# Patient Record
Sex: Female | Born: 1990 | Race: White | Hispanic: No | Marital: Married | Smoking: Never smoker
Health system: Southern US, Community
[De-identification: ages and names within clinical notes are randomized; demographics above are authoritative.]

## PROBLEM LIST (undated history)

## (undated) DIAGNOSIS — K449 Diaphragmatic hernia without obstruction or gangrene: Secondary | ICD-10-CM

## (undated) DIAGNOSIS — O24419 Gestational diabetes mellitus in pregnancy, unspecified control: Secondary | ICD-10-CM

## (undated) HISTORY — DX: Gestational diabetes mellitus in pregnancy, unspecified control: O24.419

## (undated) HISTORY — PX: CHOLECYSTECTOMY: SHX55

---

## 2019-06-14 LAB — OB RESULTS CONSOLE RPR: RPR: NONREACTIVE

## 2019-06-14 LAB — OB RESULTS CONSOLE ANTIBODY SCREEN: Antibody Screen: NEGATIVE

## 2019-06-14 LAB — OB RESULTS CONSOLE ABO/RH: RH Type: NEGATIVE

## 2019-06-14 LAB — OB RESULTS CONSOLE GC/CHLAMYDIA
Chlamydia: NEGATIVE
Gonorrhea: NEGATIVE

## 2019-06-14 LAB — OB RESULTS CONSOLE HEPATITIS B SURFACE ANTIGEN: Hepatitis B Surface Ag: NEGATIVE

## 2019-06-14 LAB — OB RESULTS CONSOLE HIV ANTIBODY (ROUTINE TESTING): HIV: NONREACTIVE

## 2019-06-14 LAB — OB RESULTS CONSOLE RUBELLA ANTIBODY, IGM: Rubella: IMMUNE

## 2019-06-25 NOTE — L&D Delivery Note (Signed)
Delivery Note  SVD viable female Apgars 8,9 over 2nd degree ML lac.  Placenta delivered spontaneously intact with 3VC. Repair with 2-0 Chromic with good support and hemostasis noted.  R/V exam confirms.  PH art was sent.   Mother and baby to [facility] and are doing well.  EBL 100cc  Diondra Pines, MD 

## 2019-09-20 ENCOUNTER — Inpatient Hospital Stay (HOSPITAL_BASED_OUTPATIENT_CLINIC_OR_DEPARTMENT_OTHER): Payer: BC Managed Care – PPO

## 2019-09-20 ENCOUNTER — Inpatient Hospital Stay (HOSPITAL_COMMUNITY)
Admission: AD | Admit: 2019-09-20 | Discharge: 2019-09-21 | Disposition: A | Discharge status: Home / Self Care | Payer: BC Managed Care – PPO | Attending: Obstetrics and Gynecology | Admitting: Obstetrics and Gynecology

## 2019-09-20 ENCOUNTER — Other Ambulatory Visit: Payer: Self-pay

## 2019-09-20 ENCOUNTER — Encounter (HOSPITAL_COMMUNITY): Payer: Self-pay | Admitting: Obstetrics and Gynecology

## 2019-09-20 DIAGNOSIS — O4692 Antepartum hemorrhage, unspecified, second trimester: Secondary | ICD-10-CM | POA: Diagnosis present

## 2019-09-20 DIAGNOSIS — Z8616 Personal history of COVID-19: Secondary | ICD-10-CM | POA: Diagnosis not present

## 2019-09-20 DIAGNOSIS — Z881 Allergy status to other antibiotic agents status: Secondary | ICD-10-CM | POA: Insufficient documentation

## 2019-09-20 DIAGNOSIS — Z3A22 22 weeks gestation of pregnancy: Secondary | ICD-10-CM

## 2019-09-20 HISTORY — DX: Diaphragmatic hernia without obstruction or gangrene: K44.9

## 2019-09-20 LAB — URINALYSIS, ROUTINE W REFLEX MICROSCOPIC
Bilirubin Urine: NEGATIVE
Glucose, UA: NEGATIVE mg/dL
Ketones, ur: NEGATIVE mg/dL
Leukocytes,Ua: NEGATIVE
Nitrite: NEGATIVE
Protein, ur: NEGATIVE mg/dL
Specific Gravity, Urine: 1.006 (ref 1.005–1.030)
pH: 7 (ref 5.0–8.0)

## 2019-09-20 LAB — WET PREP, GENITAL
Clue Cells Wet Prep HPF POC: NONE SEEN
Sperm: NONE SEEN
Trich, Wet Prep: NONE SEEN
WBC, Wet Prep HPF POC: NONE SEEN
Yeast Wet Prep HPF POC: NONE SEEN

## 2019-09-20 NOTE — MAU Note (Signed)
Pt states she went back to work on Friday after being out due to Covid. States she started having some brown discharge that looked like snot. States she called MD office and was told to be off feet over the weekend. States she went back to work for half a day today and started to have bright red blood while trying to have a BM. Pt reports some mild sharp pain and lower abdominal cramping. Rates pain 2/10. Denies LOF.

## 2019-09-21 NOTE — MAU Provider Note (Signed)
History     CSN: [id number]  Arrival date and time: 09/20/19 2030   First Provider Initiated Contact with Patient 09/20/19 2234      Chief Complaint  Patient presents with  . Vaginal Bleeding   Ms.  Jamie Wagner is a 29 y.o. year old G1P0 female at [redacted]w[redacted]d weeks gestation who presents to MAU reporting she returned to work from being out with COVID-19 on Friday 09/17/19 and started having brown d/c "like snot." She was told by her OB to "stay off feet over the weekend." She returned to working 1/2 day today and saw BRB after having a BM, mild sharp pain in lower abdominal pain/cramping; rated 2/10. She denies LOF. She reports (+) FM. She receives [facility] with [facility].   OB History    Gravida  1   Para      Term      Preterm      AB      Living        SAB      TAB      Ectopic      Multiple      Live Births              Past Medical History:  Diagnosis Date  . Hiatal hernia     Past Surgical History:  Procedure Laterality Date  . CHOLECYSTECTOMY      History reviewed. No pertinent family history.  Social History   Tobacco Use  . Smoking status: Never Smoker  . Smokeless tobacco: Never Used  Substance Use Topics  . Alcohol use: Never  . Drug use: Never    Allergies:  Allergies  Allergen Reactions  . Azithromycin Nausea And Vomiting    No medications prior to admission.    Review of Systems  Constitutional: Negative.   HENT: Negative.   Eyes: Negative.   Respiratory: Negative.   Cardiovascular: Negative.   Gastrointestinal: Negative.   Endocrine: Negative.   Genitourinary: Positive for pelvic pain (lower abdominal cramping) and vaginal bleeding (BRB after BM @ 1700 today, none since arrival to MAU).  Musculoskeletal: Negative.   Skin: Negative.   Allergic/Immunologic: Negative.   Neurological: Negative.   Hematological: Negative.   Psychiatric/Behavioral: Negative.    Physical Exam   Blood pressure 121/78, pulse 88,  temperature 97.9 F (36.6 C), temperature source Oral, resp. rate 18, height 5\' 5"  (1.651 m), weight 71.6 kg, SpO2 100 %.  Physical Exam  Nursing note and vitals reviewed. Constitutional: She is oriented to person, place, and time. She appears well-developed and well-nourished.  HENT:  Head: Normocephalic and atraumatic.  Eyes: Pupils are equal, round, and reactive to light.  Cardiovascular: Normal rate.  Respiratory: Effort normal.  GI: Soft.  Genitourinary:    Genitourinary Comments: Uterus: gravid, S=D, SE: cervix is smooth, pink, no lesions, small amt of thick mucoid discharge with brown streaks throughout coming from cervical os; removed without difficulty with large cotton swab -- WP, GC/CT done, closed/long/firm, no CMT or friability, no adnexal tenderness    Musculoskeletal:        General: Normal range of motion.     Cervical back: Normal range of motion.  Neurological: She is alert and oriented to person, place, and time.  Skin: Skin is warm and dry.  Psychiatric: She has a normal mood and affect. Her behavior is normal. Judgment and thought content normal.    MAU Course  Procedures  MDM CCUA Wet Prep GC/CT -- Results pending  OB MFM Limited U/S -- as seen on preliminary results below  Results for orders placed or performed during the hospital encounter of 09/20/19 (from the past 24 hour(s))  Urinalysis, Routine w reflex microscopic     Status: Abnormal   Collection Time: 09/20/19 10:16 PM  Result Value Ref Range   Color, Urine STRAW (A) YELLOW   APPearance CLEAR CLEAR   Specific Gravity, Urine 1.006 1.005 - 1.030   pH 7.0 5.0 - 8.0   Glucose, UA NEGATIVE NEGATIVE mg/dL   Hgb urine dipstick SMALL (A) NEGATIVE   Bilirubin Urine NEGATIVE NEGATIVE   Ketones, ur NEGATIVE NEGATIVE mg/dL   Protein, ur NEGATIVE NEGATIVE mg/dL   Nitrite NEGATIVE NEGATIVE   Leukocytes,Ua NEGATIVE NEGATIVE   RBC / HPF 0-5 0 - 5 RBC/hpf   WBC, UA 0-5 0 - 5 WBC/hpf   Bacteria, UA RARE  (A) NONE SEEN   Squamous Epithelial / LPF 0-5 0 - 5   Mucus PRESENT   Wet prep, genital     Status: None   Collection Time: 09/20/19 10:48 PM  Result Value Ref Range   Yeast Wet Prep HPF POC NONE SEEN NONE SEEN   Trich, Wet Prep NONE SEEN NONE SEEN   Clue Cells Wet Prep HPF POC NONE SEEN NONE SEEN   WBC, Wet Prep HPF POC NONE SEEN NONE SEEN   Sperm NONE SEEN     Media Information        Assessment and Plan  Vaginal bleeding in pregnancy, second trimester  - Information provided on vaginal bleeding in 2nd trimester pregnancy - Return to MAU:  If you have heavier bleeding that soaks through more that 2 pads per hour for an hour or more  If you bleed so much that you feel like you might pass out or you do pass out  If you have significant abdominal pain that is not improved with Tylenol   If you develop a fever > 100.5   - Discharge patient - Keep scheduled appt with [facility] - Patient verbalized an understanding of the plan of care and agrees.   *Patient and spouse requested information on COVID-19 Vaccine and pregnancy -- ACOG information was emailed to her email address on-file after discharge home.   Laury Deep, CNM 09/21/2019, 10:34 PM

## 2019-09-22 LAB — GC/CHLAMYDIA PROBE AMP (~~LOC~~) NOT AT ARMC
Chlamydia: NEGATIVE
Comment: NEGATIVE
Comment: NORMAL
Neisseria Gonorrhea: NEGATIVE

## 2019-12-23 LAB — OB RESULTS CONSOLE GBS: GBS: NEGATIVE

## 2020-01-05 ENCOUNTER — Telehealth (HOSPITAL_COMMUNITY): Payer: Self-pay | Admitting: *Deleted

## 2020-01-05 NOTE — Telephone Encounter (Signed)
06/25/2018

## 2020-01-07 ENCOUNTER — Inpatient Hospital Stay (HOSPITAL_COMMUNITY): Payer: BC Managed Care – PPO | Admitting: Anesthesiology

## 2020-01-07 ENCOUNTER — Inpatient Hospital Stay (HOSPITAL_COMMUNITY)
Admission: AD | Admit: 2020-01-07 | Discharge: 2020-01-10 | DRG: 807 | Disposition: A | Discharge status: Home / Self Care | Payer: BC Managed Care – PPO | Attending: Obstetrics and Gynecology | Admitting: Obstetrics and Gynecology

## 2020-01-07 ENCOUNTER — Other Ambulatory Visit: Payer: Self-pay

## 2020-01-07 ENCOUNTER — Encounter (HOSPITAL_COMMUNITY): Payer: Self-pay | Admitting: Obstetrics and Gynecology

## 2020-01-07 DIAGNOSIS — Z20822 Contact with and (suspected) exposure to covid-19: Secondary | ICD-10-CM | POA: Diagnosis present

## 2020-01-07 DIAGNOSIS — Z3A38 38 weeks gestation of pregnancy: Secondary | ICD-10-CM | POA: Diagnosis not present

## 2020-01-07 DIAGNOSIS — O134 Gestational [pregnancy-induced] hypertension without significant proteinuria, complicating childbirth: Principal | ICD-10-CM | POA: Diagnosis present

## 2020-01-07 LAB — TYPE AND SCREEN
ABO/RH(D): O NEG
Antibody Screen: POSITIVE

## 2020-01-07 LAB — COMPREHENSIVE METABOLIC PANEL
ALT: 19 U/L (ref 0–44)
AST: 27 U/L (ref 15–41)
Albumin: 2.8 g/dL — ABNORMAL LOW (ref 3.5–5.0)
Alkaline Phosphatase: 122 U/L (ref 38–126)
Anion gap: 9 (ref 5–15)
BUN: 6 mg/dL (ref 6–20)
CO2: 19 mmol/L — ABNORMAL LOW (ref 22–32)
Calcium: 8.7 mg/dL — ABNORMAL LOW (ref 8.9–10.3)
Chloride: 109 mmol/L (ref 98–111)
Creatinine, Ser: 0.68 mg/dL (ref 0.44–1.00)
GFR calc Af Amer: >60 mL/min (ref >60)
GFR calc non Af Amer: >60 mL/min (ref >60)
Glucose, Bld: 84 mg/dL (ref 70–99)
Potassium: 4.3 mmol/L (ref 3.5–5.1)
Sodium: 137 mmol/L (ref 135–145)
Total Bilirubin: 0.7 mg/dL (ref 0.3–1.2)
Total Protein: 6.1 g/dL — ABNORMAL LOW (ref 6.5–8.1)

## 2020-01-07 LAB — ABO/RH: ABO/RH(D): O NEG

## 2020-01-07 LAB — CBC
HCT: 39.8 % (ref 36.0–46.0)
Hemoglobin: 12.5 g/dL (ref 12.0–15.0)
MCH: 27.7 pg (ref 26.0–34.0)
MCHC: 31.4 g/dL (ref 30.0–36.0)
MCV: 88.2 fL (ref 80.0–100.0)
Platelets: 249 10*3/uL (ref 150–400)
RBC: 4.51 MIL/uL (ref 3.87–5.11)
RDW: 14.2 % (ref 11.5–15.5)
WBC: 10.3 10*3/uL (ref 4.0–10.5)
nRBC: 0 % (ref 0.0–0.2)

## 2020-01-07 LAB — SARS CORONAVIRUS 2 BY RT PCR (HOSPITAL ORDER, PERFORMED IN ~~LOC~~ HOSPITAL LAB): SARS Coronavirus 2: NEGATIVE

## 2020-01-07 MED ORDER — TERBUTALINE SULFATE 1 MG/ML IJ SOLN: 0.2500 mg | Freq: Once | INTRAMUSCULAR | Status: DC | PRN

## 2020-01-07 MED ORDER — PHENYLEPHRINE 40 MCG/ML (10ML) SYRINGE FOR IV PUSH (FOR BLOOD PRESSURE SUPPORT): 80.0000 ug | PREFILLED_SYRINGE | INTRAVENOUS | Status: DC | PRN

## 2020-01-07 MED ORDER — OXYCODONE-ACETAMINOPHEN 5-325 MG PO TABS: 2.0000 | ORAL_TABLET | ORAL | Status: DC | PRN

## 2020-01-07 MED ORDER — EPHEDRINE 5 MG/ML INJ: 10.0000 mg | INTRAVENOUS | Status: DC | PRN

## 2020-01-07 MED ORDER — LIDOCAINE HCL (PF) 1 % IJ SOLN
INTRAMUSCULAR | Status: DC | PRN
  Administered 2020-01-07: 10 mL via EPIDURAL

## 2020-01-07 MED ORDER — OXYTOCIN BOLUS FROM INFUSION
333.0000 mL | Freq: Once | INTRAVENOUS | Status: AC
  Administered 2020-01-08: 333 mL via INTRAVENOUS

## 2020-01-07 MED ORDER — DIPHENHYDRAMINE HCL 50 MG/ML IJ SOLN: 12.5000 mg | INTRAMUSCULAR | Status: DC | PRN

## 2020-01-07 MED ORDER — LIDOCAINE HCL (PF) 1 % IJ SOLN: 30.0000 mL | INTRAMUSCULAR | Status: DC | PRN

## 2020-01-07 MED ORDER — SODIUM CHLORIDE (PF) 0.9 % IJ SOLN
INTRAMUSCULAR | Status: DC | PRN
  Administered 2020-01-07: 12 mL/h via EPIDURAL

## 2020-01-07 MED ORDER — OXYTOCIN-SODIUM CHLORIDE 30-0.9 UT/500ML-% IV SOLN
1.0000 m[IU]/min | INTRAVENOUS | Status: DC
  Administered 2020-01-07: 2 m[IU]/min via INTRAVENOUS
  Filled 2020-01-07: qty 500

## 2020-01-07 MED ORDER — LACTATED RINGERS IV SOLN: INTRAVENOUS | Status: DC

## 2020-01-07 MED ORDER — LACTATED RINGERS IV SOLN: 500.0000 mL | INTRAVENOUS | Status: DC | PRN

## 2020-01-07 MED ORDER — FENTANYL CITRATE (PF) 100 MCG/2ML IJ SOLN
INTRAMUSCULAR | Status: AC
  Administered 2020-01-07: 100 ug
  Filled 2020-01-07: qty 2

## 2020-01-07 MED ORDER — ONDANSETRON HCL 4 MG/2ML IJ SOLN
4.0000 mg | Freq: Four times a day (QID) | INTRAMUSCULAR | Status: DC | PRN
  Administered 2020-01-08: 4 mg via INTRAVENOUS
  Filled 2020-01-07: qty 2

## 2020-01-07 MED ORDER — SOD CITRATE-CITRIC ACID 500-334 MG/5ML PO SOLN: 30.0000 mL | ORAL | Status: DC | PRN

## 2020-01-07 MED ORDER — FENTANYL-BUPIVACAINE-NACL 0.5-0.125-0.9 MG/250ML-% EP SOLN
12.0000 mL/h | EPIDURAL | Status: DC | PRN
  Filled 2020-01-07: qty 250

## 2020-01-07 MED ORDER — OXYTOCIN-SODIUM CHLORIDE 30-0.9 UT/500ML-% IV SOLN
2.5000 [IU]/h | INTRAVENOUS | Status: DC
  Filled 2020-01-07: qty 500

## 2020-01-07 MED ORDER — ACETAMINOPHEN 325 MG PO TABS: 650.0000 mg | ORAL_TABLET | ORAL | Status: DC | PRN

## 2020-01-07 MED ORDER — OXYCODONE-ACETAMINOPHEN 5-325 MG PO TABS: 1.0000 | ORAL_TABLET | ORAL | Status: DC | PRN

## 2020-01-07 MED ORDER — LACTATED RINGERS IV SOLN
500.0000 mL | Freq: Once | INTRAVENOUS | Status: AC
  Administered 2020-01-07: 500 mL via INTRAVENOUS

## 2020-01-07 NOTE — Anesthesia Procedure Notes (Addendum)
Epidural Patient location during procedure: OB  Staffing Anesthesiologist: Elmer Picker, MD Performed: anesthesiologist   Preanesthetic Checklist Completed: patient identified, IV checked, risks and benefits discussed, monitors and equipment checked, pre-op evaluation and timeout performed  Epidural Patient position: sitting Prep: DuraPrep and site prepped and draped Patient monitoring: continuous pulse ox, blood pressure, heart rate and cardiac monitor Approach: midline Location: L3-L4 Injection technique: LOR air  Needle:  Needle type: Tuohy  Needle gauge: 17 G Needle length: 9 cm Needle insertion depth: 5 cm Catheter type: closed end flexible Catheter size: 19 Gauge Catheter at skin depth: 11 cm Test dose: negative  Assessment Sensory level: T8 Events: blood not aspirated, injection not painful, no injection resistance, no paresthesia and negative IV test  Additional Notes Patient identified. Risks/Benefits/Options discussed with patient including but not limited to bleeding, infection, nerve damage, paralysis, failed block, incomplete pain control, headache, blood pressure changes, nausea, vomiting, reactions to medication both or allergic, itching and postpartum back pain. Confirmed with bedside nurse the patient's most recent platelet count. Confirmed with patient that they are not currently taking any anticoagulation, have any bleeding history or any family history of bleeding disorders. Patient expressed understanding and wished to proceed. All questions were answered. Sterile technique was used throughout the entire procedure. Please see nursing notes for vital signs. Test dose was given through epidural catheter and negative prior to continuing to dose epidural or start infusion. Warning signs of high block given to the patient including shortness of breath, tingling/numbness in hands, complete motor block, or any concerning symptoms with instructions to call for  help. Patient was given instructions on fall risk and not to get out of bed. All questions and concerns addressed with instructions to call with any issues or inadequate analgesia.  Reason for block:procedure for pain

## 2020-01-07 NOTE — H&P (Signed)
Jamie Wagner is a 29 y.o. female presenting for IOL at term due to [facility].  Seen in the office today by Dr Henderson Cloud with elevated BPs (not severe) and with favorable cx per Dr Henderson Cloud and sent for IOL.  Denies [place], RUQ pain, HA GBS neg . OB History    Gravida  1   Para  0   Term  0   Preterm  0   AB  0   Living  0     SAB  0   TAB  0   Ectopic  0   Multiple  0   Live Births  0          Past Medical History:  Diagnosis Date  . Hiatal hernia    Past Surgical History:  Procedure Laterality Date  . CHOLECYSTECTOMY     Family History: family history is not on file. Social History:  reports that she has never smoked. She has never used smokeless tobacco. She reports that she does not drink alcohol and does not use drugs.     Maternal Diabetes: No Genetic Screening: Normal Maternal Ultrasounds/Referrals: Normal Fetal Ultrasounds or other Referrals:  None Maternal Substance Abuse:  No Significant Maternal Medications:  None Significant Maternal Lab Results:  Group B Strep negative Other Comments:  None  Review of Systems History Dilation: 3 Effacement (%): 50 Station: Ballotable Exam by:: Montez Morita, RNC Resp. rate 18, height 5\' 5"  (1.651 m), weight 89.7 kg. Exam Physical Exam  Prenatal labs: ABO, Rh: --/--/O NEG Performed at [facility], [address] [place], [place]  [phone]) Antibody: POS (07/16 1310) Rubella: Immune (12/21 0000) RPR: Nonreactive (12/21 0000)  HBsAg: Negative (12/21 0000)  HIV: Non-reactive (12/21 0000)  GBS: Negative/-- (07/01 0000)   Assessment/Plan: Gestational HTN at term Normal labs and no severe features Was admitted and started on pitocin, but not having strong ctxs despite increases in pit.  Cx exam per RN is ballotable so unable to AROM.  Will cont with Pit for now, but consider change to cytotec overnight.  Clinically stable.   08-29-2005 01/07/2020, 4:44 PM

## 2020-01-07 NOTE — Anesthesia Preprocedure Evaluation (Signed)
Anesthesia Evaluation  Patient identified by MRN, date of birth, ID band Patient awake    Reviewed: Allergy & Precautions, NPO status , Patient's Chart, lab work & pertinent test results  Airway Mallampati: II  TM Distance: >3 FB Neck ROM: Full    Dental no notable dental hx.    Pulmonary neg pulmonary ROS,    Pulmonary exam normal breath sounds clear to auscultation       Cardiovascular negative cardio ROS Normal cardiovascular exam Rhythm:Regular Rate:Normal     Neuro/Psych negative neurological ROS  negative psych ROS   GI/Hepatic Neg liver ROS, hiatal hernia,   Endo/Other  negative endocrine ROS  Renal/GU negative Renal ROS  negative genitourinary   Musculoskeletal negative musculoskeletal ROS (+)   Abdominal   Peds  Hematology negative hematology ROS (+)   Anesthesia Other Findings IOL for gHTN  Reproductive/Obstetrics (+) Pregnancy                             Anesthesia Physical Anesthesia Plan  ASA: II  Anesthesia Plan: Epidural   Post-op Pain Management:    Induction:   PONV Risk Score and Plan: Treatment may vary due to age or medical condition  Airway Management Planned: Natural Airway  Additional Equipment:   Intra-op Plan:   Post-operative Plan:   Informed Consent: I have reviewed the patients History and Physical, chart, labs and discussed the procedure including the risks, benefits and alternatives for the proposed anesthesia with the patient or authorized representative who has indicated his/her understanding and acceptance.       Plan Discussed with: Anesthesiologist  Anesthesia Plan Comments: (Patient identified. Risks, benefits, options discussed with patient including but not limited to bleeding, infection, nerve damage, paralysis, failed block, incomplete pain control, headache, blood pressure changes, nausea, vomiting, reactions to medication,  itching, and post partum back pain. Confirmed with bedside nurse the patient's most recent platelet count. Confirmed with the patient that they are not taking any anticoagulation, have any bleeding history or any family history of bleeding disorders. Patient expressed understanding and wishes to proceed. All questions were answered. )        Anesthesia Quick Evaluation

## 2020-01-08 ENCOUNTER — Encounter (HOSPITAL_COMMUNITY): Payer: Self-pay | Admitting: Obstetrics and Gynecology

## 2020-01-08 LAB — CBC
HCT: 35.1 % — ABNORMAL LOW (ref 36.0–46.0)
Hemoglobin: 11.3 g/dL — ABNORMAL LOW (ref 12.0–15.0)
MCH: 28.3 pg (ref 26.0–34.0)
MCHC: 32.2 g/dL (ref 30.0–36.0)
MCV: 87.8 fL (ref 80.0–100.0)
Platelets: 222 10*3/uL (ref 150–400)
RBC: 4 MIL/uL (ref 3.87–5.11)
RDW: 13.9 % (ref 11.5–15.5)
WBC: 19.3 10*3/uL — ABNORMAL HIGH (ref 4.0–10.5)
nRBC: 0 % (ref 0.0–0.2)

## 2020-01-08 LAB — RPR: RPR Ser Ql: NONREACTIVE

## 2020-01-08 MED ORDER — ACETAMINOPHEN 325 MG PO TABS
650.0000 mg | ORAL_TABLET | ORAL | Status: DC | PRN
  Administered 2020-01-08 – 2020-01-09 (×4): 650 mg via ORAL
  Filled 2020-01-08 (×4): qty 2

## 2020-01-08 MED ORDER — PRENATAL MULTIVITAMIN CH
1.0000 | ORAL_TABLET | Freq: Every day | ORAL | Status: DC
  Administered 2020-01-08 – 2020-01-10 (×3): 1 via ORAL
  Filled 2020-01-08 (×3): qty 1

## 2020-01-08 MED ORDER — BENZOCAINE-MENTHOL 20-0.5 % EX AERO
1.0000 "application " | INHALATION_SPRAY | CUTANEOUS | Status: DC | PRN
  Administered 2020-01-10: 1 via TOPICAL
  Filled 2020-01-08 (×2): qty 56

## 2020-01-08 MED ORDER — DIBUCAINE (PERIANAL) 1 % EX OINT: 1.0000 "application " | TOPICAL_OINTMENT | CUTANEOUS | Status: DC | PRN

## 2020-01-08 MED ORDER — ONDANSETRON HCL 4 MG PO TABS: 4.0000 mg | ORAL_TABLET | ORAL | Status: DC | PRN

## 2020-01-08 MED ORDER — MEASLES, MUMPS & RUBELLA VAC IJ SOLR: 0.5000 mL | Freq: Once | INTRAMUSCULAR | Status: DC

## 2020-01-08 MED ORDER — IBUPROFEN 600 MG PO TABS
600.0000 mg | ORAL_TABLET | Freq: Four times a day (QID) | ORAL | Status: DC
  Administered 2020-01-08 – 2020-01-10 (×9): 600 mg via ORAL
  Filled 2020-01-08 (×9): qty 1

## 2020-01-08 MED ORDER — PANTOPRAZOLE SODIUM 40 MG PO TBEC
40.0000 mg | DELAYED_RELEASE_TABLET | Freq: Every day | ORAL | Status: DC
  Administered 2020-01-08 – 2020-01-10 (×3): 40 mg via ORAL
  Filled 2020-01-08 (×3): qty 1

## 2020-01-08 MED ORDER — OXYCODONE-ACETAMINOPHEN 5-325 MG PO TABS: 1.0000 | ORAL_TABLET | ORAL | Status: DC | PRN

## 2020-01-08 MED ORDER — SIMETHICONE 80 MG PO CHEW: 80.0000 mg | CHEWABLE_TABLET | ORAL | Status: DC | PRN

## 2020-01-08 MED ORDER — SENNOSIDES-DOCUSATE SODIUM 8.6-50 MG PO TABS
2.0000 | ORAL_TABLET | ORAL | Status: DC
  Administered 2020-01-08 – 2020-01-09 (×2): 2 via ORAL
  Filled 2020-01-08 (×2): qty 2

## 2020-01-08 MED ORDER — ZOLPIDEM TARTRATE 5 MG PO TABS: 5.0000 mg | ORAL_TABLET | Freq: Every evening | ORAL | Status: DC | PRN

## 2020-01-08 MED ORDER — WITCH HAZEL-GLYCERIN EX PADS: 1.0000 "application " | MEDICATED_PAD | CUTANEOUS | Status: DC | PRN

## 2020-01-08 MED ORDER — MEDROXYPROGESTERONE ACETATE 150 MG/ML IM SUSP: 150.0000 mg | INTRAMUSCULAR | Status: DC | PRN

## 2020-01-08 MED ORDER — OXYCODONE-ACETAMINOPHEN 5-325 MG PO TABS: 2.0000 | ORAL_TABLET | ORAL | Status: DC | PRN

## 2020-01-08 MED ORDER — ONDANSETRON HCL 4 MG/2ML IJ SOLN: 4.0000 mg | INTRAMUSCULAR | Status: DC | PRN

## 2020-01-08 MED ORDER — DIPHENHYDRAMINE HCL 25 MG PO CAPS: 25.0000 mg | ORAL_CAPSULE | Freq: Four times a day (QID) | ORAL | Status: DC | PRN

## 2020-01-08 MED ORDER — TETANUS-DIPHTH-ACELL PERTUSSIS 5-2.5-18.5 LF-MCG/0.5 IM SUSP: 0.5000 mL | Freq: Once | INTRAMUSCULAR | Status: DC

## 2020-01-08 MED ORDER — COCONUT OIL OIL: 1.0000 "application " | TOPICAL_OIL | Status: DC | PRN

## 2020-01-08 NOTE — Anesthesia Postprocedure Evaluation (Signed)
Anesthesia Post Note  Patient: Licensed conveyancer  Procedure(s) Performed: AN AD HOC LABOR EPIDURAL     Patient location during evaluation: Mother Baby Anesthesia Type: Epidural Level of consciousness: awake and alert Pain management: pain level controlled Vital Signs Assessment: post-procedure vital signs reviewed and stable Respiratory status: spontaneous breathing, nonlabored ventilation and respiratory function stable Cardiovascular status: stable Postop Assessment: no headache, no backache and epidural receding Anesthetic complications: no Comments: Per telephone conversation   No complications documented.  Last Vitals:  Vitals:   01/08/20 1511 01/08/20 1836  BP: 134/86 132/85  Pulse: 94 64  Resp: 16 16  Temp: 36.7 C 37 C  SpO2: 100% 100%    Last Pain:  Vitals:   01/08/20 1836  TempSrc: Oral  PainSc: 2    Pain Goal:                Epidural/Spinal Function Cutaneous sensation: Normal sensation (01/08/20 1836), Patient able to flex knees: Yes (01/08/20 1836), Patient able to lift hips off bed: Yes (01/08/20 1836), Back pain beyond tenderness at insertion site: No (01/08/20 1836), Progressively worsening motor and/or sensory loss: No (01/08/20 1836), Bowel and/or bladder incontinence post epidural: No (01/08/20 1836)  Emmaline Kluver N

## 2020-01-08 NOTE — Progress Notes (Signed)
MOB was referred for history of anxiety. °* Referral screened out by Clinical Social Worker because none of the following criteria appear to apply: °~ History of anxiety/depression during this pregnancy, or of post-partum depression following prior delivery. °~ Diagnosis of anxiety and/or depression within last 3 years °OR °* MOB's symptoms currently being treated with medication and/or therapy. °Please contact the Clinical Social Worker if needs arise, by MOB request, or if MOB scores greater than 9/yes to question 10 on Edinburgh Postpartum Depression Screen. ° °Donna Silverman D. Rosea Dory, MSW, LCSW °Clinical Social Worker °[phone] °

## 2020-01-09 LAB — CBC
HCT: 33.4 % — ABNORMAL LOW (ref 36.0–46.0)
Hemoglobin: 10.3 g/dL — ABNORMAL LOW (ref 12.0–15.0)
MCH: 27.3 pg (ref 26.0–34.0)
MCHC: 30.8 g/dL (ref 30.0–36.0)
MCV: 88.6 fL (ref 80.0–100.0)
Platelets: 189 10*3/uL (ref 150–400)
RBC: 3.77 MIL/uL — ABNORMAL LOW (ref 3.87–5.11)
RDW: 14.1 % (ref 11.5–15.5)
WBC: 11.6 10*3/uL — ABNORMAL HIGH (ref 4.0–10.5)
nRBC: 0 % (ref 0.0–0.2)

## 2020-01-09 MED ORDER — IBUPROFEN 600 MG PO TABS: 600.0000 mg | ORAL_TABLET | Freq: Four times a day (QID) | ORAL | 0 refills | Status: DC

## 2020-01-09 MED ORDER — RHO D IMMUNE GLOBULIN 1500 UNIT/2ML IJ SOSY
300.0000 ug | PREFILLED_SYRINGE | Freq: Once | INTRAMUSCULAR | Status: AC
  Administered 2020-01-09: 300 ug via INTRAVENOUS
  Filled 2020-01-09: qty 2

## 2020-01-09 NOTE — Discharge Summary (Signed)
Postpartum Discharge Summary       Patient Name: Jamie Wagner DOB: August 02, 1990 MRN: [id number]  Date of admission: 01/07/2020 Delivery date:01/08/2020  Delivering provider: Louretta Shorten  Date of discharge: 01/09/2020  Admitting diagnosis: Normal labor and delivery [O80] Intrauterine pregnancy: [redacted]w[redacted]d    Secondary diagnosis:  Active Problems:   Normal labor and delivery  Additional problems: gestational HTN    Discharge diagnosis: Term Pregnancy Delivered                                              Post partum procedures:  Augmentation: AROM and Pitocin Complications: None  Hospital course: Induction of Labor With Vaginal Delivery   29y.o. yo G1P1001 at 366w4das admitted to the hospital 01/07/2020 for induction of labor.  Indication for induction: Gestational hypertension.  Patient had an uncomplicated labor course as follows: Membrane Rupture Time/Date: 6:19 PM ,01/07/2020   Delivery Method:Vaginal, Spontaneous  Episiotomy: None  Lacerations:  2nd degree  Details of delivery can be found in separate delivery note.  Patient had a routine postpartum course. Patient is discharged home 01/09/20.  Newborn Data: Birth date:01/08/2020  Birth time:7:43 AM  Gender:Female  Living status:Living  Apgars:8 ,9  Weight:2980 g   Magnesium Sulfate received: No BMZ received: No Rhophylac:N/A MMR:No T-DaP:Given prenatally Flu: N/A Transfusion:No  Physical exam  Vitals:   01/08/20 1511 01/08/20 1836 01/08/20 2245 01/09/20 0529  BP: 134/86 132/85 129/86 117/72  Pulse: 94 64 79 68  Resp: 16 16 17 16   Temp: 98 F (36.7 C) 98.6 F (37 C) 98 F (36.7 C) 97.9 F (36.6 C)  TempSrc: Oral Oral Oral Oral  SpO2: 100% 100% 99% 99%  Weight:      Height:       General: alert, cooperative and no distress Lochia: appropriate Uterine Fundus: firm Incision: Healing well with no significant drainage DVT Evaluation: No evidence of DVT seen on physical exam. Labs: Lab Results   Component Value Date   WBC 11.6 (H) 01/09/2020   HGB 10.3 (L) 01/09/2020   HCT 33.4 (L) 01/09/2020   MCV 88.6 01/09/2020   PLT 189 01/09/2020   CMP Latest Ref Rng & Units 01/07/2020  Glucose 70 - 99 mg/dL 84  BUN 6 - 20 mg/dL 6  Creatinine 0.44 - 1.00 mg/dL 0.68  Sodium 135 - 145 mmol/L 137  Potassium 3.5 - 5.1 mmol/L 4.3  Chloride 98 - 111 mmol/L 109  CO2 22 - 32 mmol/L 19(L)  Calcium 8.9 - 10.3 mg/dL 8.7(L)  Total Protein 6.5 - 8.1 g/dL 6.1(L)  Total Bilirubin 0.3 - 1.2 mg/dL 0.7  Alkaline Phos 38 - 126 U/L 122  AST 15 - 41 U/L 27  ALT 0 - 44 U/L 19   Edinburgh Score: Edinburgh Postnatal Depression Scale Screening Tool 01/08/2020  I have been able to laugh and see the funny side of things. 0  I have looked forward with enjoyment to things. 0  I have blamed myself unnecessarily when things went wrong. 0  I have been anxious or worried for no good reason. 1  I have felt scared or panicky for no good reason. 0  Things have been getting on top of me. 0  I have been so unhappy that I have had difficulty sleeping. 0  I have felt sad or miserable. 0  I have  been so unhappy that I have been crying. 0  The thought of harming myself has occurred to me. 0  Edinburgh Postnatal Depression Scale Total 1      After visit meds:  Allergies as of 01/09/2020      Reactions   Azithromycin Nausea And Vomiting   Cefdinir    Other reaction(s): GI intolerance   Iodinated Diagnostic Agents Nausea Only   Pseudoephedrine Anxiety      Medication List    TAKE these medications   acetaminophen 325 MG tablet Commonly known as: TYLENOL Take 325 mg by mouth every 6 (six) hours as needed for mild pain or headache.   Bonjesta 20-20 MG Tbcr Generic drug: Doxylamine-Pyridoxine ER Take 1 tablet by mouth at bedtime.   ibuprofen 600 MG tablet Commonly known as: ADVIL Take 1 tablet (600 mg total) by mouth every 6 (six) hours.   omeprazole 20 MG capsule Commonly known as: PRILOSEC Take 20 mg  by mouth daily.   prenatal multivitamin Tabs tablet Take 1 tablet by mouth daily at 12 noon.   PROBIOTIC-10 PO Take 1 capsule by mouth daily.   simethicone 80 MG chewable tablet Commonly known as: MYLICON Chew 80 mg by mouth every 6 (six) hours as needed for flatulence.        Discharge home in stable condition Infant Feeding: Bottle Infant Disposition:home with mother Discharge instruction: per After Visit Summary and Postpartum booklet. Activity: Advance as tolerated. Pelvic rest for 6 weeks.  Diet: routine diet Anticipated Birth Control: Unsure Postpartum Appointment:6 weeks Additional Postpartum F/U:   Future Appointments: Future Appointments  Date Time Provider [place]  01/12/2020  9:15 AM [facility]-SCREENING [facility] None   Follow up Visit:      01/09/2020 Luz Lex, MD

## 2020-01-10 LAB — RH IG WORKUP (INCLUDES ABO/RH)
ABO/RH(D): O NEG
Fetal Screen: NEGATIVE
Gestational Age(Wks): 38.4
Unit division: 0

## 2020-01-10 NOTE — Discharge Summary (Signed)
Postpartum Discharge Summary  Date of Service updated January 10, 2020     Patient Name: Jamie Wagner DOB: 1991/05/20 MRN: [id number]  Date of admission: 01/07/2020 Delivery date:01/08/2020  Delivering provider: Louretta Shorten  Date of discharge: 01/10/2020  Admitting diagnosis: Normal labor and delivery [O80] Intrauterine pregnancy: 105w4d    Secondary diagnosis:  Active Problems:   Normal labor and delivery  Additional problems: none    Discharge diagnosis: Term Pregnancy Delivered                                              Post partum procedures:none Augmentation: AROM and Pitocin Complications: None  Hospital course: Induction of Labor With Vaginal Delivery   29y.o. yo G1P1001 at 35w4das admitted to the hospital 01/07/2020 for induction of labor.  Indication for induction: Gestational hypertension.  Patient had an uncomplicated labor course as follows: Membrane Rupture Time/Date: 6:19 PM ,01/07/2020   Delivery Method:Vaginal, Spontaneous  Episiotomy: None  Lacerations:  2nd degree  Details of delivery can be found in separate delivery note.  Patient had a routine postpartum course. Patient is discharged home 01/10/20.  Newborn Data: Birth date:01/08/2020  Birth time:7:43 AM  Gender:Female  Living status:Living  Apgars:8 ,9  Weight:2980 g   Magnesium Sulfate received: No BMZ received: No Rhophylac:No MMR:No T-DaP:Given prenatally Flu: Yes Transfusion:No  Physical exam  Vitals:   01/08/20 2245 01/09/20 0529 01/09/20 1334 01/09/20 2118  BP: 129/86 117/72 134/89 130/72  Pulse: 79 68 72 75  Resp: 17 16 17 15   Temp: 98 F (36.7 C) 97.9 F (36.6 C) 98.1 F (36.7 C) 98.1 F (36.7 C)  TempSrc: Oral Oral Oral Oral  SpO2: 99% 99% 99% 99%  Weight:      Height:       General: alert, cooperative and no distress Lochia: appropriate Uterine Fundus: firm Incision: Healing well with no significant drainage DVT Evaluation: No evidence of DVT seen on physical  exam. Labs: Lab Results  Component Value Date   WBC 11.6 (H) 01/09/2020   HGB 10.3 (L) 01/09/2020   HCT 33.4 (L) 01/09/2020   MCV 88.6 01/09/2020   PLT 189 01/09/2020   CMP Latest Ref Rng & Units 01/07/2020  Glucose 70 - 99 mg/dL 84  BUN 6 - 20 mg/dL 6  Creatinine 0.44 - 1.00 mg/dL 0.68  Sodium 135 - 145 mmol/L 137  Potassium 3.5 - 5.1 mmol/L 4.3  Chloride 98 - 111 mmol/L 109  CO2 22 - 32 mmol/L 19(L)  Calcium 8.9 - 10.3 mg/dL 8.7(L)  Total Protein 6.5 - 8.1 g/dL 6.1(L)  Total Bilirubin 0.3 - 1.2 mg/dL 0.7  Alkaline Phos 38 - 126 U/L 122  AST 15 - 41 U/L 27  ALT 0 - 44 U/L 19   Edinburgh Score: Edinburgh Postnatal Depression Scale Screening Tool 01/08/2020  I have been able to laugh and see the funny side of things. 0  I have looked forward with enjoyment to things. 0  I have blamed myself unnecessarily when things went wrong. 0  I have been anxious or worried for no good reason. 1  I have felt scared or panicky for no good reason. 0  Things have been getting on top of me. 0  I have been so unhappy that I have had difficulty sleeping. 0  I have felt sad or miserable.  0  I have been so unhappy that I have been crying. 0  The thought of harming myself has occurred to me. 0  Edinburgh Postnatal Depression Scale Total 1      After visit meds:  Allergies as of 01/10/2020      Reactions   Azithromycin Nausea And Vomiting   Cefdinir    Other reaction(s): GI intolerance   Iodinated Diagnostic Agents Nausea Only   Pseudoephedrine Anxiety      Medication List    TAKE these medications   acetaminophen 325 MG tablet Commonly known as: TYLENOL Take 325 mg by mouth every 6 (six) hours as needed for mild pain or headache.   Bonjesta 20-20 MG Tbcr Generic drug: Doxylamine-Pyridoxine ER Take 1 tablet by mouth at bedtime.   ibuprofen 600 MG tablet Commonly known as: ADVIL Take 1 tablet (600 mg total) by mouth every 6 (six) hours.   omeprazole 20 MG capsule Commonly  known as: PRILOSEC Take 20 mg by mouth daily.   prenatal multivitamin Tabs tablet Take 1 tablet by mouth daily at 12 noon.   PROBIOTIC-10 PO Take 1 capsule by mouth daily.   simethicone 80 MG chewable tablet Commonly known as: MYLICON Chew 80 mg by mouth every 6 (six) hours as needed for flatulence.        Discharge home in stable condition Infant Feeding: Breast Infant Disposition:home with mother Discharge instruction: per After Visit Summary and Postpartum booklet. Activity: Advance as tolerated. Pelvic rest for 6 weeks.  Diet: routine diet Anticipated Birth Control: Unsure Postpartum Appointment:6 weeks Additional Postpartum F/U: none Future Appointments: Future Appointments  Date Time Provider [place]  01/12/2020  9:15 AM [facility]CREENING [facility] None   Follow up Visit:      01/10/2020 Cyril Mourning, MD

## 2020-01-12 ENCOUNTER — Other Ambulatory Visit (HOSPITAL_COMMUNITY): Payer: BC Managed Care – PPO

## 2020-01-14 ENCOUNTER — Inpatient Hospital Stay (HOSPITAL_COMMUNITY): Payer: BC Managed Care – PPO

## 2021-11-30 IMAGING — US US MFM OB LIMITED
1 series · 15 of 27 positions shown · non-contrast
Comparison: none

[Series 1: us mfm ob limited · 15 of 27 slices shown]
[im 1/27]
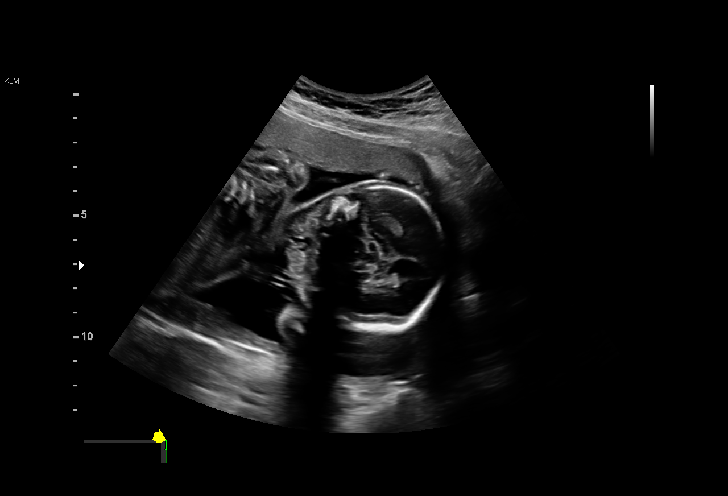
[im 3/27]
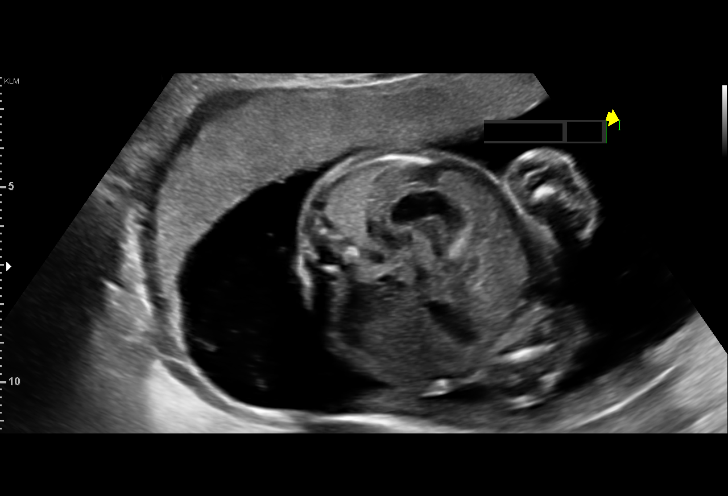
[im 5/27]
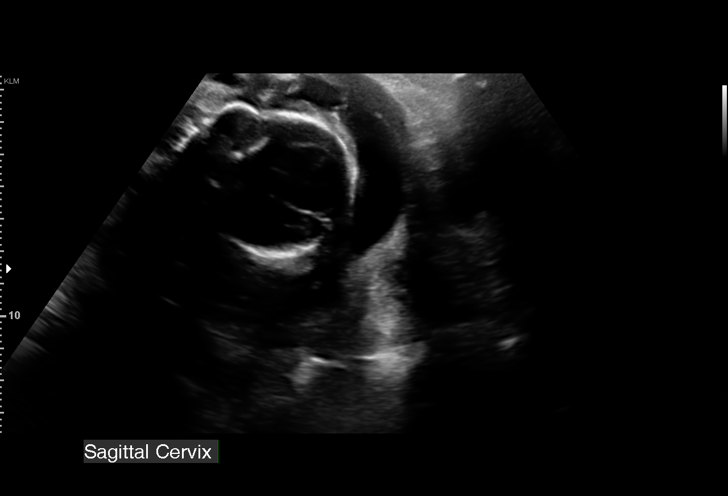
[im 7/27]
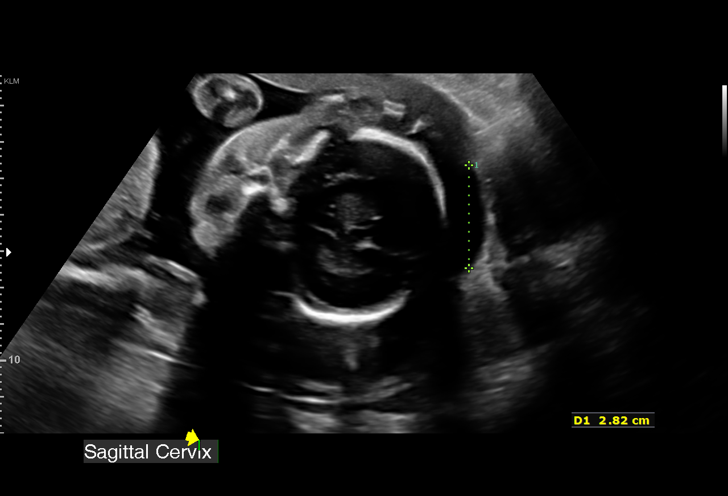
[im 9/27]
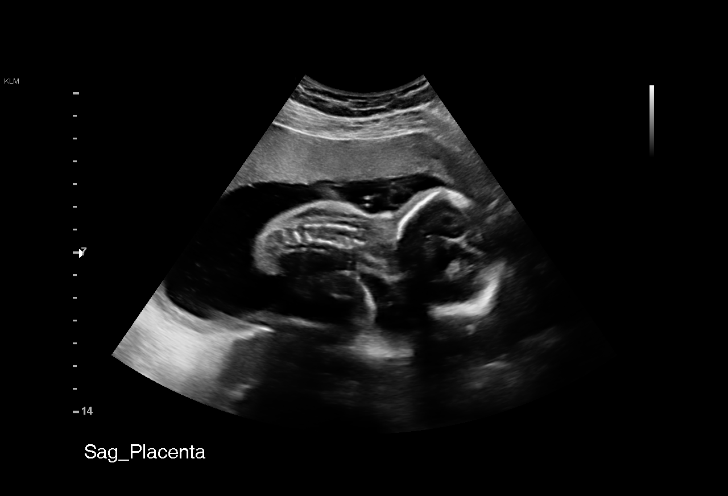
[im 10/27]
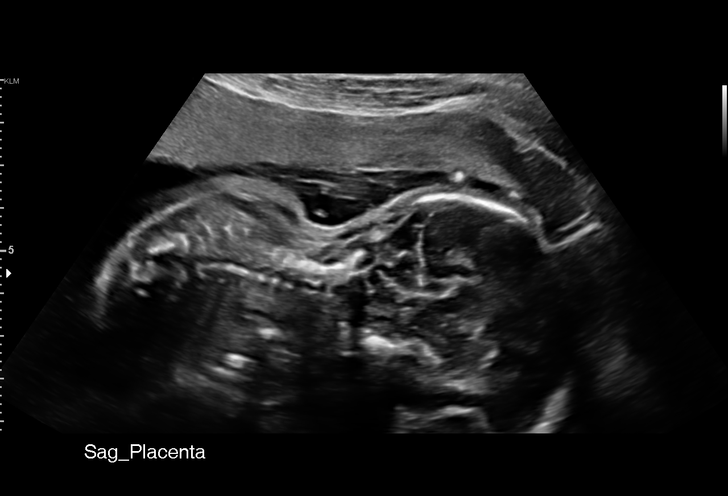
[im 12/27]
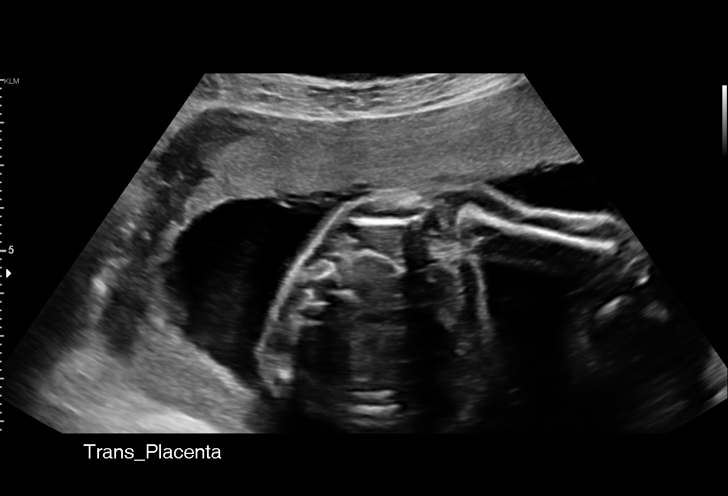
[im 14/27]
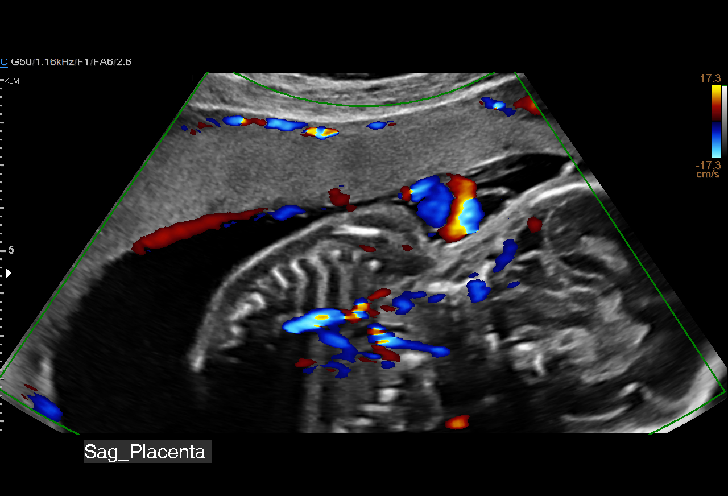
[im 16/27]
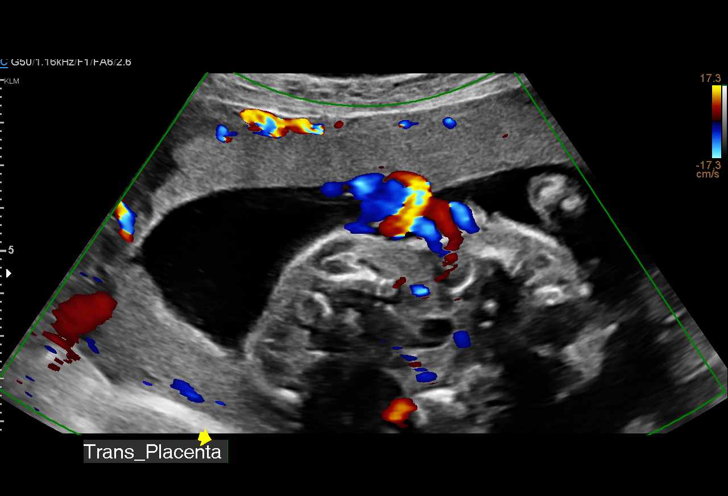
[im 18/27]
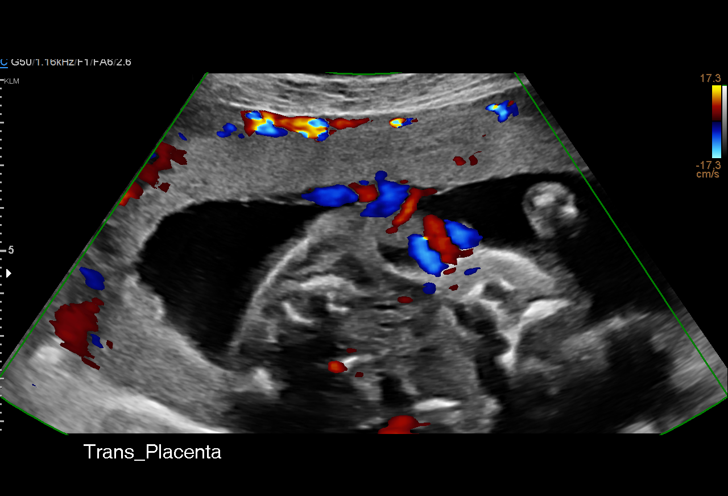
[im 19/27]
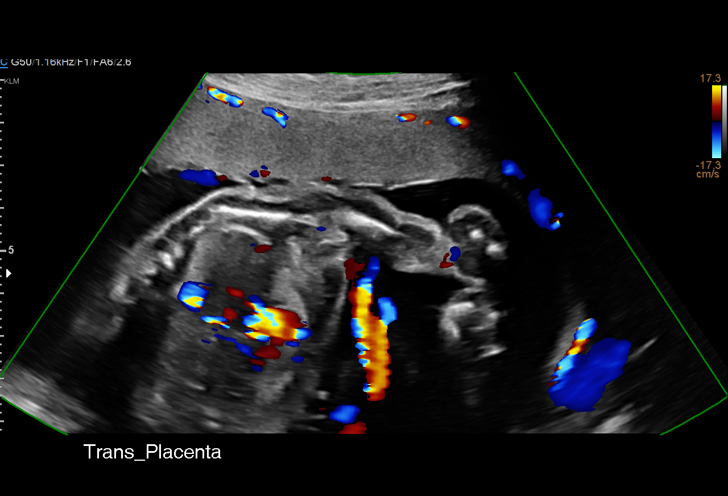
[im 21/27]
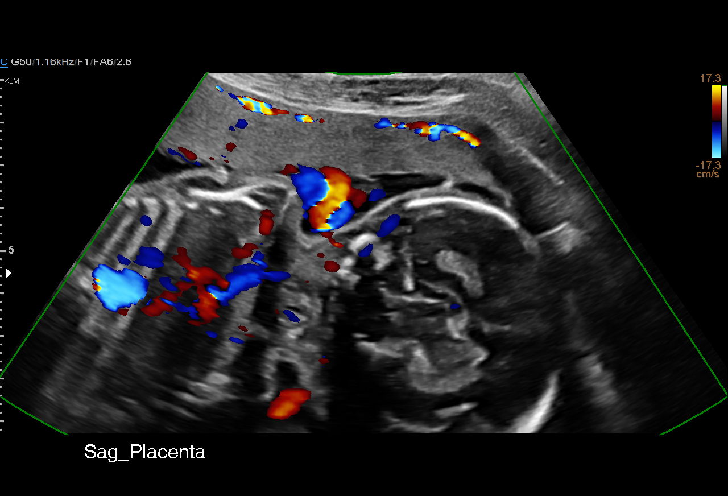
[im 23/27]
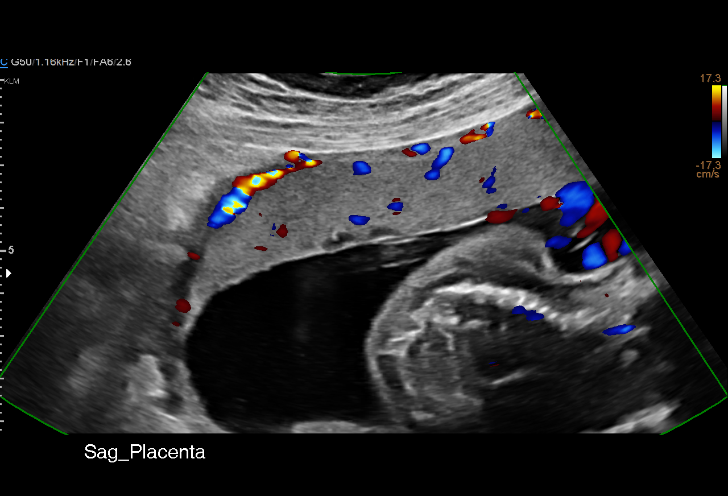
[im 25/27]
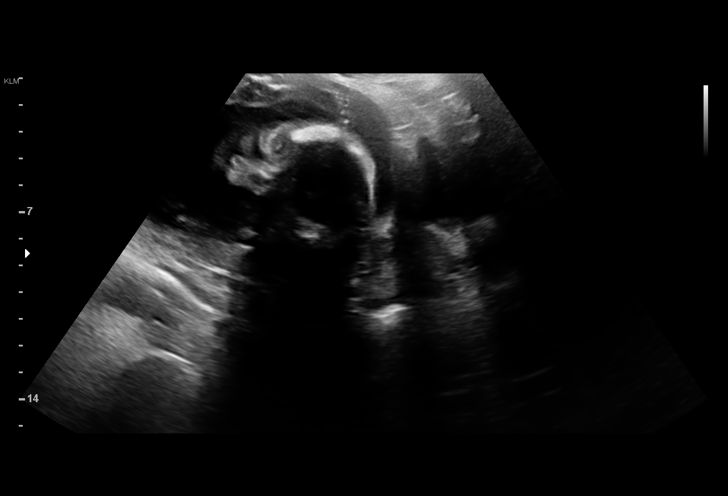
[im 27/27]
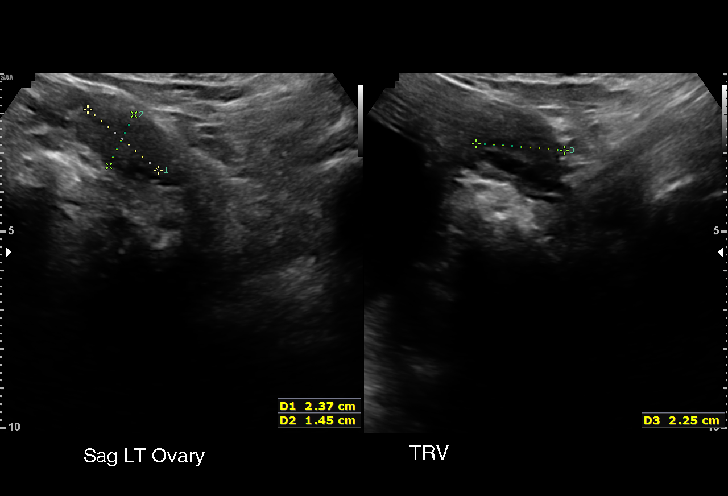

[15 of 27 positions shown; findings below may reference images not displayed]

1  US MFM OB LIMITED                    76815.01     SAMUEL [place] BOLLENDORFF
 ----------------------------------------------------------------------

 ----------------------------------------------------------------------
Indications

  Vaginal bleeding in pregnancy, second
  trimester
  22 weeks gestation of pregnancy
 ----------------------------------------------------------------------
Fetal Evaluation

 Num Of Fetuses:          1
 Fetal Heart Rate(bpm):   158
 Cardiac Activity:        Observed
 Presentation:            Cephalic
 Placenta:                Anterior
 P. Cord Insertion:       Visualized

 Amniotic Fluid
 AFI FV:      Within normal limits

                             Largest Pocket(cm)


 Comment:    No placental abruption or previa identified.
OB History

 Gravidity:    1         Term:   0        Prem:   0        SAB:   0
 TOP:          0       Ectopic:  0        Living: 0
Gestational Age

 Best:          22w 6d     Det. By:  Early Ultrasound         EDD:   01/18/20
Anatomy

 Stomach:               Appears normal, left   Bladder:                Appears normal
                        sided
Cervix Uterus Adnexa

 Cervix
 Length:            3.3  cm.
 Normal appearance by transabdominal scan.

 Uterus
 No abnormality visualized.

 Left Ovary
 Within normal limits.

 Right Ovary
 Within normal limits.

 Adnexa
 No abnormality visualized.
Impression

 Vaginal bleeding in pregnancy
 No signs of placental abruption or previa
 There is good amniotic fluid
Recommendations

 Schedule detailed anatomy at first available appointment.

## 2022-06-06 LAB — OB RESULTS CONSOLE RUBELLA ANTIBODY, IGM: Rubella: IMMUNE

## 2022-06-06 LAB — OB RESULTS CONSOLE HIV ANTIBODY (ROUTINE TESTING): HIV: NONREACTIVE

## 2022-06-06 LAB — OB RESULTS CONSOLE RPR: RPR: NONREACTIVE

## 2022-06-06 LAB — OB RESULTS CONSOLE HEPATITIS B SURFACE ANTIGEN: Hepatitis B Surface Ag: NEGATIVE

## 2022-06-06 LAB — OB RESULTS CONSOLE ABO/RH: RH Type: NEGATIVE

## 2022-06-06 LAB — OB RESULTS CONSOLE ANTIBODY SCREEN: Antibody Screen: NEGATIVE

## 2022-06-06 LAB — HEPATITIS C ANTIBODY: HCV Ab: NEGATIVE

## 2022-06-20 LAB — OB RESULTS CONSOLE GC/CHLAMYDIA
Chlamydia: NEGATIVE
Neisseria Gonorrhea: NEGATIVE

## 2022-06-24 NOTE — L&D Delivery Note (Signed)
Delivery Note At 4:05 PM a viable female was delivered via Vaginal, Spontaneous (Presentation:   Occiput Anterior).  APGAR: 8, 9; weight pending.   Placenta status: Spontaneous, Intact.  Cord: 3 vessels with the following complications: None.  Cord pH: n/a  Anesthesia: Epidural Episiotomy: None Lacerations:  2nd degree Suture Repair: 3.0 vicryl rapide Est. Blood Loss (mL):  70  Mom to postpartum.  Baby to Couplet care / Skin to Skin.  Mitchel Honour 01/10/2023, 4:23 PM

## 2022-12-16 LAB — OB RESULTS CONSOLE GBS: GBS: NEGATIVE

## 2022-12-27 ENCOUNTER — Telehealth (HOSPITAL_COMMUNITY): Payer: Self-pay | Admitting: *Deleted

## 2022-12-27 ENCOUNTER — Encounter (HOSPITAL_COMMUNITY): Payer: Self-pay | Admitting: *Deleted

## 2022-12-27 NOTE — Telephone Encounter (Signed)
Preadmission screen  

## 2023-01-01 ENCOUNTER — Telehealth (HOSPITAL_COMMUNITY): Payer: Self-pay | Admitting: *Deleted

## 2023-01-01 ENCOUNTER — Encounter (HOSPITAL_COMMUNITY): Payer: Self-pay | Admitting: *Deleted

## 2023-01-01 NOTE — Telephone Encounter (Signed)
Preadmission screen  

## 2023-01-10 ENCOUNTER — Inpatient Hospital Stay (HOSPITAL_COMMUNITY): Payer: BC Managed Care – PPO

## 2023-01-10 ENCOUNTER — Inpatient Hospital Stay (HOSPITAL_COMMUNITY): Payer: BC Managed Care – PPO | Admitting: Anesthesiology

## 2023-01-10 ENCOUNTER — Inpatient Hospital Stay (HOSPITAL_COMMUNITY)
Admission: AD | Admit: 2023-01-10 | Discharge: 2023-01-11 | DRG: 807 | Disposition: A | Discharge status: Home / Self Care | Payer: BC Managed Care – PPO | Attending: Obstetrics & Gynecology | Admitting: Obstetrics & Gynecology

## 2023-01-10 ENCOUNTER — Other Ambulatory Visit: Payer: Self-pay

## 2023-01-10 ENCOUNTER — Encounter (HOSPITAL_COMMUNITY): Payer: Self-pay | Admitting: Obstetrics & Gynecology

## 2023-01-10 DIAGNOSIS — Z3A39 39 weeks gestation of pregnancy: Secondary | ICD-10-CM | POA: Diagnosis not present

## 2023-01-10 DIAGNOSIS — O24420 Gestational diabetes mellitus in childbirth, diet controlled: Secondary | ICD-10-CM | POA: Diagnosis present

## 2023-01-10 DIAGNOSIS — Z6791 Unspecified blood type, Rh negative: Secondary | ICD-10-CM | POA: Diagnosis not present

## 2023-01-10 DIAGNOSIS — O26893 Other specified pregnancy related conditions, third trimester: Secondary | ICD-10-CM | POA: Diagnosis present

## 2023-01-10 DIAGNOSIS — O24419 Gestational diabetes mellitus in pregnancy, unspecified control: Principal | ICD-10-CM | POA: Diagnosis present

## 2023-01-10 LAB — CBC
HCT: 37.1 % (ref 36.0–46.0)
Hemoglobin: 12 g/dL (ref 12.0–15.0)
MCH: 28 pg (ref 26.0–34.0)
MCHC: 32.3 g/dL (ref 30.0–36.0)
MCV: 86.7 fL (ref 80.0–100.0)
Platelets: 210 10*3/uL (ref 150–400)
RBC: 4.28 MIL/uL (ref 3.87–5.11)
RDW: 13.8 % (ref 11.5–15.5)
WBC: 9.8 10*3/uL (ref 4.0–10.5)
nRBC: 0 % (ref 0.0–0.2)

## 2023-01-10 LAB — TYPE AND SCREEN
ABO/RH(D): O NEG
Antibody Screen: POSITIVE

## 2023-01-10 LAB — GLUCOSE, CAPILLARY
Glucose-Capillary: 88 mg/dL (ref 70–99)
Glucose-Capillary: 94 mg/dL (ref 70–99)
Glucose-Capillary: 99 mg/dL (ref 70–99)
Glucose-Capillary: 99 mg/dL (ref 70–99)

## 2023-01-10 LAB — HIV ANTIBODY (ROUTINE TESTING W REFLEX): HIV Screen 4th Generation wRfx: NONREACTIVE

## 2023-01-10 MED ORDER — FENTANYL-BUPIVACAINE-NACL 0.5-0.125-0.9 MG/250ML-% EP SOLN: 12.0000 mL/h | EPIDURAL | Status: DC | PRN

## 2023-01-10 MED ORDER — ONDANSETRON HCL 4 MG/2ML IJ SOLN: 4.0000 mg | INTRAMUSCULAR | Status: DC | PRN

## 2023-01-10 MED ORDER — FENTANYL CITRATE (PF) 100 MCG/2ML IJ SOLN: 50.0000 ug | INTRAMUSCULAR | Status: DC | PRN

## 2023-01-10 MED ORDER — MISOPROSTOL 25 MCG QUARTER TABLET
25.0000 ug | ORAL_TABLET | ORAL | Status: DC | PRN
  Administered 2023-01-10 (×2): 25 ug via VAGINAL
  Filled 2023-01-10 (×2): qty 1

## 2023-01-10 MED ORDER — ACETAMINOPHEN 325 MG PO TABS
650.0000 mg | ORAL_TABLET | ORAL | Status: DC | PRN
  Administered 2023-01-11 (×2): 650 mg via ORAL
  Filled 2023-01-10 (×2): qty 2

## 2023-01-10 MED ORDER — LACTATED RINGERS IV SOLN: INTRAVENOUS | Status: DC

## 2023-01-10 MED ORDER — PHENYLEPHRINE 80 MCG/ML (10ML) SYRINGE FOR IV PUSH (FOR BLOOD PRESSURE SUPPORT): 80.0000 ug | PREFILLED_SYRINGE | INTRAVENOUS | Status: DC | PRN

## 2023-01-10 MED ORDER — WITCH HAZEL-GLYCERIN EX PADS: 1.0000 | MEDICATED_PAD | CUTANEOUS | Status: DC | PRN

## 2023-01-10 MED ORDER — FENTANYL-BUPIVACAINE-NACL 0.5-0.125-0.9 MG/250ML-% EP SOLN
EPIDURAL | Status: DC | PRN
  Administered 2023-01-10: 12 mL/h via EPIDURAL

## 2023-01-10 MED ORDER — BENZOCAINE-MENTHOL 20-0.5 % EX AERO
1.0000 | INHALATION_SPRAY | CUTANEOUS | Status: DC | PRN
  Administered 2023-01-10: 1 via TOPICAL
  Filled 2023-01-10: qty 56

## 2023-01-10 MED ORDER — TERBUTALINE SULFATE 1 MG/ML IJ SOLN
0.2500 mg | Freq: Once | INTRAMUSCULAR | Status: DC | PRN
  Filled 2023-01-10: qty 1

## 2023-01-10 MED ORDER — OXYTOCIN-SODIUM CHLORIDE 30-0.9 UT/500ML-% IV SOLN
2.5000 [IU]/h | INTRAVENOUS | Status: DC
  Filled 2023-01-10 (×2): qty 500

## 2023-01-10 MED ORDER — OXYTOCIN-SODIUM CHLORIDE 30-0.9 UT/500ML-% IV SOLN
1.0000 m[IU]/min | INTRAVENOUS | Status: DC
  Administered 2023-01-10: 2 m[IU]/min via INTRAVENOUS

## 2023-01-10 MED ORDER — EPHEDRINE 5 MG/ML INJ: 10.0000 mg | INTRAVENOUS | Status: DC | PRN

## 2023-01-10 MED ORDER — SENNOSIDES-DOCUSATE SODIUM 8.6-50 MG PO TABS
2.0000 | ORAL_TABLET | Freq: Every day | ORAL | Status: DC
  Administered 2023-01-11: 2 via ORAL
  Filled 2023-01-10: qty 2

## 2023-01-10 MED ORDER — ZOLPIDEM TARTRATE 5 MG PO TABS
5.0000 mg | ORAL_TABLET | Freq: Every evening | ORAL | Status: DC | PRN
  Administered 2023-01-10: 5 mg via ORAL
  Filled 2023-01-10: qty 1

## 2023-01-10 MED ORDER — DIPHENHYDRAMINE HCL 25 MG PO CAPS: 25.0000 mg | ORAL_CAPSULE | Freq: Four times a day (QID) | ORAL | Status: DC | PRN

## 2023-01-10 MED ORDER — DIBUCAINE (PERIANAL) 1 % EX OINT: 1.0000 | TOPICAL_OINTMENT | CUTANEOUS | Status: DC | PRN

## 2023-01-10 MED ORDER — PHENYLEPHRINE 80 MCG/ML (10ML) SYRINGE FOR IV PUSH (FOR BLOOD PRESSURE SUPPORT)
PREFILLED_SYRINGE | INTRAVENOUS | Status: AC
  Filled 2023-01-10: qty 10

## 2023-01-10 MED ORDER — OXYCODONE-ACETAMINOPHEN 5-325 MG PO TABS: 1.0000 | ORAL_TABLET | ORAL | Status: DC | PRN

## 2023-01-10 MED ORDER — LACTATED RINGERS IV SOLN: 500.0000 mL | INTRAVENOUS | Status: DC | PRN

## 2023-01-10 MED ORDER — IBUPROFEN 600 MG PO TABS
600.0000 mg | ORAL_TABLET | Freq: Four times a day (QID) | ORAL | Status: DC
  Administered 2023-01-10 – 2023-01-11 (×4): 600 mg via ORAL
  Filled 2023-01-10 (×3): qty 1

## 2023-01-10 MED ORDER — ERYTHROMYCIN 5 MG/GM OP OINT
TOPICAL_OINTMENT | OPHTHALMIC | Status: AC
  Filled 2023-01-10: qty 1

## 2023-01-10 MED ORDER — LIDOCAINE HCL (PF) 1 % IJ SOLN: 30.0000 mL | INTRAMUSCULAR | Status: DC | PRN

## 2023-01-10 MED ORDER — TERBUTALINE SULFATE 1 MG/ML IJ SOLN: 0.2500 mg | Freq: Once | INTRAMUSCULAR | Status: DC | PRN

## 2023-01-10 MED ORDER — PHENYLEPHRINE 80 MCG/ML (10ML) SYRINGE FOR IV PUSH (FOR BLOOD PRESSURE SUPPORT)
80.0000 ug | PREFILLED_SYRINGE | INTRAVENOUS | Status: DC | PRN
  Administered 2023-01-10: 80 ug via INTRAVENOUS

## 2023-01-10 MED ORDER — COCONUT OIL OIL: 1.0000 | TOPICAL_OIL | Status: DC | PRN

## 2023-01-10 MED ORDER — ONDANSETRON HCL 4 MG/2ML IJ SOLN
4.0000 mg | Freq: Four times a day (QID) | INTRAMUSCULAR | Status: DC | PRN
  Administered 2023-01-10: 4 mg via INTRAVENOUS
  Filled 2023-01-10: qty 2

## 2023-01-10 MED ORDER — ACETAMINOPHEN 325 MG PO TABS: 650.0000 mg | ORAL_TABLET | ORAL | Status: DC | PRN

## 2023-01-10 MED ORDER — ZOLPIDEM TARTRATE 5 MG PO TABS: 5.0000 mg | ORAL_TABLET | Freq: Every evening | ORAL | Status: DC | PRN

## 2023-01-10 MED ORDER — PRENATAL MULTIVITAMIN CH
1.0000 | ORAL_TABLET | Freq: Every day | ORAL | Status: DC
  Administered 2023-01-11: 1 via ORAL
  Filled 2023-01-10: qty 1

## 2023-01-10 MED ORDER — SIMETHICONE 80 MG PO CHEW: 80.0000 mg | CHEWABLE_TABLET | ORAL | Status: DC | PRN

## 2023-01-10 MED ORDER — LACTATED RINGERS IV SOLN: 500.0000 mL | Freq: Once | INTRAVENOUS | Status: DC

## 2023-01-10 MED ORDER — OXYCODONE-ACETAMINOPHEN 5-325 MG PO TABS: 2.0000 | ORAL_TABLET | ORAL | Status: DC | PRN

## 2023-01-10 MED ORDER — TETANUS-DIPHTH-ACELL PERTUSSIS 5-2.5-18.5 LF-MCG/0.5 IM SUSY: 0.5000 mL | PREFILLED_SYRINGE | Freq: Once | INTRAMUSCULAR | Status: DC

## 2023-01-10 MED ORDER — OXYTOCIN BOLUS FROM INFUSION
333.0000 mL | Freq: Once | INTRAVENOUS | Status: AC
  Administered 2023-01-10: 333 mL via INTRAVENOUS

## 2023-01-10 MED ORDER — LIDOCAINE HCL (PF) 1 % IJ SOLN
INTRAMUSCULAR | Status: DC | PRN
  Administered 2023-01-10: 10 mL via EPIDURAL
  Administered 2023-01-10: 2 mL via EPIDURAL

## 2023-01-10 MED ORDER — FENTANYL-BUPIVACAINE-NACL 0.5-0.125-0.9 MG/250ML-% EP SOLN
EPIDURAL | Status: AC
  Filled 2023-01-10: qty 250

## 2023-01-10 MED ORDER — SOD CITRATE-CITRIC ACID 500-334 MG/5ML PO SOLN: 30.0000 mL | ORAL | Status: DC | PRN

## 2023-01-10 MED ORDER — DIPHENHYDRAMINE HCL 50 MG/ML IJ SOLN: 12.5000 mg | INTRAMUSCULAR | Status: DC | PRN

## 2023-01-10 MED ORDER — ONDANSETRON HCL 4 MG PO TABS: 4.0000 mg | ORAL_TABLET | ORAL | Status: DC | PRN

## 2023-01-10 NOTE — Anesthesia Preprocedure Evaluation (Signed)
Anesthesia Evaluation  Patient identified by MRN, date of birth, ID band Patient awake    Reviewed: Allergy & Precautions, Patient's Chart, lab work & pertinent test results  Airway Mallampati: II  TM Distance: >3 FB Neck ROM: Full    Dental no notable dental hx.    Pulmonary neg pulmonary ROS   Pulmonary exam normal breath sounds clear to auscultation       Cardiovascular negative cardio ROS Normal cardiovascular exam Rhythm:Regular Rate:Normal     Neuro/Psych negative neurological ROS  negative psych ROS   GI/Hepatic negative GI ROS, Neg liver ROS,,,  Endo/Other  negative endocrine ROSdiabetes, Well Controlled, Gestational    Renal/GU negative Renal ROS  negative genitourinary   Musculoskeletal negative musculoskeletal ROS (+)    Abdominal   Peds negative pediatric ROS (+)  Hematology negative hematology ROS (+) Hb 12, plt 210   Anesthesia Other Findings   Reproductive/Obstetrics (+) Pregnancy                             Anesthesia Physical Anesthesia Plan  ASA: 2  Anesthesia Plan: Epidural   Post-op Pain Management:    Induction:   PONV Risk Score and Plan: 2  Airway Management Planned: Natural Airway  Additional Equipment: None  Intra-op Plan:   Post-operative Plan:   Informed Consent: I have reviewed the patients History and Physical, chart, labs and discussed the procedure including the risks, benefits and alternatives for the proposed anesthesia with the patient or authorized representative who has indicated his/her understanding and acceptance.       Plan Discussed with:   Anesthesia Plan Comments:        Anesthesia Quick Evaluation

## 2023-01-10 NOTE — Progress Notes (Signed)
Rn discussed baby scripts with patient.

## 2023-01-10 NOTE — Progress Notes (Signed)
SVE 3/50/-2, AROM for scant clear fluid Will start pitocin 2/2 FHT Cat I  Mitchel Honour, DO

## 2023-01-10 NOTE — Anesthesia Procedure Notes (Signed)
Epidural Patient location during procedure: OB Start time: 01/10/2023 10:04 AM End time: 01/10/2023 10:13 AM  Staffing Anesthesiologist: Lannie Fields, DO Performed: anesthesiologist   Preanesthetic Checklist Completed: patient identified, IV checked, risks and benefits discussed, monitors and equipment checked, pre-op evaluation and timeout performed  Epidural Patient position: sitting Prep: DuraPrep and site prepped and draped Patient monitoring: continuous pulse ox, blood pressure, heart rate and cardiac monitor Approach: midline Location: L3-L4 Injection technique: LOR air  Needle:  Needle type: Tuohy  Needle gauge: 17 G Needle length: 9 cm Needle insertion depth: 6 cm Catheter type: closed end flexible Catheter size: 19 Gauge Catheter at skin depth: 11 cm Test dose: negative  Assessment Sensory level: T8 Events: blood not aspirated, no cerebrospinal fluid, injection not painful, no injection resistance, no paresthesia and negative IV test  Additional Notes Patient identified. Risks/Benefits/Options discussed with patient including but not limited to bleeding, infection, nerve damage, paralysis, failed block, incomplete pain control, headache, blood pressure changes, nausea, vomiting, reactions to medication both or allergic, itching and postpartum back pain. Confirmed with bedside nurse the patient's most recent platelet count. Confirmed with patient that they are not currently taking any anticoagulation, have any bleeding history or any family history of bleeding disorders. Patient expressed understanding and wished to proceed. All questions were answered. Sterile technique was used throughout the entire procedure. Please see nursing notes for vital signs. Test dose was given through epidural catheter and negative prior to continuing to dose epidural or start infusion. Warning signs of high block given to the patient including shortness of breath, tingling/numbness in  hands, complete motor block, or any concerning symptoms with instructions to call for help. Patient was given instructions on fall risk and not to get out of bed. All questions and concerns addressed with instructions to call with any issues or inadequate analgesia.  Reason for block:procedure for pain

## 2023-01-10 NOTE — H&P (Signed)
Jamie Wagner is a 32 y.o. female G2P1001 at [redacted]w[redacted]d presenting for IOL secondary to A1DM.  Antepartum course otherwise uncomplicated.  H/O GHTN in previous pregnancy; normotensive during this pregnancy.  Rh negative.  GBS negative.  Patient received 2nd dose of VMP at 0500; feeling mild cramping.  OB History     Gravida  2   Para  1   Term  1   Preterm  0   AB  0   Living  1      SAB  0   IAB  0   Ectopic  0   Multiple  0   Live Births  1          Past Medical History:  Diagnosis Date   Gestational diabetes    Past Surgical History:  Procedure Laterality Date   CHOLECYSTECTOMY     Family History: family history is not on file. Social History:  reports that she has never smoked. She has never used smokeless tobacco. She reports that she does not drink alcohol and does not use drugs.     Maternal Diabetes: Yes:  Diabetes Type:  Diet controlled Genetic Screening: Normal Maternal Ultrasounds/Referrals: Normal Fetal Ultrasounds or other Referrals:  None Maternal Substance Abuse:  No Significant Maternal Medications:  None Significant Maternal Lab Results:  Group B Strep negative and Rh negative Number of Prenatal Visits:greater than 3 verified prenatal visits Other Comments:  None  Review of Systems Maternal Medical History:  Fetal activity: Perceived fetal activity is normal.   Last perceived fetal movement was within the past hour.   Prenatal complications: no prenatal complications Prenatal Complications - Diabetes: gestational. Diabetes is managed by diet.     Dilation: 1 Effacement (%): 50 Station: -2 Exam by:: Gillen, RN Blood pressure 114/74, pulse 74, temperature 98.4 F (36.9 C), temperature source Oral, resp. rate 16, height 5\' 5"  (1.651 m), weight 90.9 kg, unknown if currently breastfeeding. Maternal Exam:  Uterine Assessment: Contraction strength is mild.  Contraction frequency is irregular.  Abdomen: Patient reports no abdominal  tenderness. Fundal height is c/w dates.   Estimated fetal weight is 8#.     Fetal Exam Fetal Monitor Review: Baseline rate: 135.  Variability: moderate (6-25 bpm).   Pattern: accelerations present and no decelerations.   Fetal State Assessment: Category I - tracings are normal.   Physical Exam Constitutional:      Appearance: Normal appearance.  HENT:     Head: Normocephalic and atraumatic.  Pulmonary:     Effort: Pulmonary effort is normal.  Abdominal:     Palpations: Abdomen is soft.  Musculoskeletal:        General: Normal range of motion.     Cervical back: Normal range of motion.  Skin:    General: Skin is warm and dry.  Neurological:     Mental Status: She is alert and oriented to person, place, and time.  Psychiatric:        Mood and Affect: Mood normal.        Behavior: Behavior normal.     Prenatal labs: ABO, Rh: --/--/O NEG (07/19 0044) Antibody: POS (07/19 0044) Rubella: Immune (12/14 0000) RPR: Nonreactive (12/14 0000)  HBsAg: Negative (12/14 0000)  HIV: Non Reactive (07/19 0048)  GBS: Negative/-- (06/24 0000)   Assessment/Plan: 32yo G2P1001 at [redacted]w[redacted]d for IOL secondary to A1DM  -A1DM-CBGs q 4h; good control -Recheck cvx at 900 to determine if more VMP is needed  -CLEA if desired -Anticipate NSVD  Mitchel Honour 01/10/2023, 8:15 AM

## 2023-01-10 NOTE — Progress Notes (Signed)
Jamie Wagner is a 32 y.o. G2P1001 at [redacted]w[redacted]d by ultrasound admitted for induction of labor due to Gestational diabetes.  Subjective: No c/o.  Comfortable with CLEA.  Objective: BP (!) 95/54   Pulse (!) 54   Temp 98.4 F (36.9 C) (Oral)   Resp 16   Ht 5\' 5"  (1.651 m)   Wt 90.9 kg   BMI 33.33 kg/m  No intake/output data recorded. No intake/output data recorded.  FHT:  FHR: 130 bpm, variability: moderate,  accelerations:  Present,  decelerations:  Absent UC:   regular, every 2 minutes; pitocin just decreased from 4mU to 2mU SVE:   Dilation: 4 Effacement (%): 70 Station: -1 Exam by:: Kiearra Oyervides,DO  Labs: Lab Results  Component Value Date   WBC 9.8 01/10/2023   HGB 12.0 01/10/2023   HCT 37.1 01/10/2023   MCV 86.7 01/10/2023   PLT 210 01/10/2023   CBG (last 3)  Recent Labs    01/10/23 0505 01/10/23 0906 01/10/23 1307  GLUCAP 99 94 99    Assessment / Plan: Induction of labor due to gestational diabetes,  progressing well on pitocin  Labor: Progressing normally Preeclampsia:   n/a Fetal Wellbeing:  Category I Pain Control:  Epidural I/D:  n/a Anticipated MOD:  NSVD  Mitchel Honour, DO 01/10/2023, 1:29 PM

## 2023-01-10 NOTE — Plan of Care (Signed)
  Problem: Education: Goal: Knowledge of condition will improve Outcome: Completed/Met

## 2023-01-11 LAB — CBC
HCT: 37.4 % (ref 36.0–46.0)
Hemoglobin: 11.9 g/dL — ABNORMAL LOW (ref 12.0–15.0)
MCH: 27.7 pg (ref 26.0–34.0)
MCHC: 31.8 g/dL (ref 30.0–36.0)
MCV: 87 fL (ref 80.0–100.0)
Platelets: 198 10*3/uL (ref 150–400)
RBC: 4.3 MIL/uL (ref 3.87–5.11)
RDW: 13.7 % (ref 11.5–15.5)
WBC: 11.5 10*3/uL — ABNORMAL HIGH (ref 4.0–10.5)
nRBC: 0 % (ref 0.0–0.2)

## 2023-01-11 LAB — RPR: RPR Ser Ql: NONREACTIVE — AB

## 2023-01-11 MED ORDER — IBUPROFEN 600 MG PO TABS: 600.0000 mg | ORAL_TABLET | Freq: Four times a day (QID) | ORAL | 0 refills | Status: DC

## 2023-01-11 NOTE — Discharge Instructions (Signed)
Call MD for T>100.4, heavy vaginal bleeding, severe abdominal pain, or respiratory distress.  Call office to schedule postpartum visit in 6 weeks.  Pelvic rest x 6 weeks.   

## 2023-01-11 NOTE — Discharge Summary (Signed)
Postpartum Discharge Summary  Patient Name: Jamie Wagner DOB: Jun 13, 1991 MRN: [id number]  Date of admission: 01/10/2023 Delivery date:01/10/2023 Delivering provider: Mitchel Honour Date of discharge: 01/11/2023  Admitting diagnosis: Gestational diabetes [O24.419] Intrauterine pregnancy: [redacted]w[redacted]d     Secondary diagnosis:  Principal Problem:   Gestational diabetes  Additional problems: none    Discharge diagnosis: Term Pregnancy Delivered and GDM A1                                              Post partum procedures: none Augmentation: AROM, Pitocin, and Cytotec Complications: None  Hospital course: Induction of Labor With Vaginal Delivery   32 y.o. yo G2P2002 at [redacted]w[redacted]d was admitted to the hospital 01/10/2023 for induction of labor.  Indication for induction: A1 DM.  Patient had an labor course complicated by n/a Membrane Rupture Time/Date: 9:02 AM,01/10/2023  Delivery Method:Vaginal, Spontaneous Episiotomy: None Lacerations:  2nd degree Details of delivery can be found in separate delivery note.  Patient had a postpartum course complicated by n/a. Patient is discharged home 01/11/23.  Newborn Data: Birth date:01/10/2023 Birth time:4:05 PM Gender:Female Living status:Living Apgars:8 ,9  Weight:2940 g  Magnesium Sulfate received: No BMZ received: No Rhophylac:No MMR:No T-DaP:Given prenatally Flu: No Transfusion:No  Physical exam  Vitals:   01/10/23 1850 01/10/23 2050 01/11/23 0109 01/11/23 0418  BP: 116/79 (!) 96/54 123/78 (!) 92/53  Pulse: 69 (!) 53 (!) 59 (!) 52  Resp: 17 18 18 18   Temp: 97.8 F (36.6 C) 97.8 F (36.6 C) 97.8 F (36.6 C) 98 F (36.7 C)  TempSrc: Oral Oral Oral Oral  SpO2: 100%     Weight:      Height:       General: alert, cooperative, and no distress Lochia: appropriate Uterine Fundus: firm Incision: Healing well with no significant drainage, No significant erythema DVT Evaluation: No evidence of DVT seen on physical exam. Negative  Homan's sign. No cords or calf tenderness. Labs: Lab Results  Component Value Date   WBC 11.5 (H) 01/11/2023   HGB 11.9 (L) 01/11/2023   HCT 37.4 01/11/2023   MCV 87.0 01/11/2023   PLT 198 01/11/2023      Latest Ref Rng & Units 01/07/2020    2:21 PM  CMP  Glucose 70 - 99 mg/dL 84   BUN 6 - 20 mg/dL 6   Creatinine 1.02 - 7.25 mg/dL 3.66   Sodium 440 - 347 mmol/L 137   Potassium 3.5 - 5.1 mmol/L 4.3   Chloride 98 - 111 mmol/L 109   CO2 22 - 32 mmol/L 19   Calcium 8.9 - 10.3 mg/dL 8.7   Total Protein 6.5 - 8.1 g/dL 6.1   Total Bilirubin 0.3 - 1.2 mg/dL 0.7   Alkaline Phos 38 - 126 U/L 122   AST 15 - 41 U/L 27   ALT 0 - 44 U/L 19    Edinburgh Score:    01/10/2023    5:55 PM  Edinburgh Postnatal Depression Scale Screening Tool  I have been able to laugh and see the funny side of things. 0  I have looked forward with enjoyment to things. 0  I have blamed myself unnecessarily when things went wrong. 1  I have been anxious or worried for no good reason. 2  I have felt scared or panicky for no good reason. 1  Things have  been getting on top of me. 1  I have been so unhappy that I have had difficulty sleeping. 0  I have felt sad or miserable. 0  I have been so unhappy that I have been crying. 0  The thought of harming myself has occurred to me. 0  Edinburgh Postnatal Depression Scale Total 5     After visit meds:  Allergies as of 01/11/2023       Reactions   Omnicef [cefdinir] Other (See Comments)   GI intolerance   Z-pak [azithromycin] Nausea And Vomiting   Iodinated Contrast Media Nausea Only   Sudafed [pseudoephedrine] Anxiety        Medication List     TAKE these medications    ibuprofen 600 MG tablet Commonly known as: ADVIL Take 1 tablet (600 mg total) by mouth every 6 (six) hours.   omeprazole 20 MG tablet Commonly known as: PRILOSEC OTC Take 20 mg by mouth daily.   prenatal multivitamin Tabs tablet Take 1 tablet by mouth daily.   PROBIOTIC-10  PO Take 1 capsule by mouth daily.         Discharge home in stable condition Infant Feeding: Breast Infant Disposition:home with mother Discharge instruction: per After Visit Summary and Postpartum booklet. Activity: Advance as tolerated. Pelvic rest for 6 weeks.  Diet: routine diet Future Appointments:No future appointments. Follow up Visit: 6 weeks PPV  01/11/2023 Mitchel Honour, DO

## 2023-01-11 NOTE — Anesthesia Postprocedure Evaluation (Signed)
Anesthesia Post Note  Patient: Jamie Wagner  Procedure(s) Performed: AN AD HOC LABOR EPIDURAL     Patient location during evaluation: Mother Baby Anesthesia Type: Epidural Level of consciousness: awake and alert Pain management: pain level controlled Vital Signs Assessment: post-procedure vital signs reviewed and stable Respiratory status: spontaneous breathing, nonlabored ventilation and respiratory function stable Cardiovascular status: stable Postop Assessment: no headache, no backache, epidural receding and able to ambulate Anesthetic complications: no   No notable events documented.  Last Vitals:  Vitals:   01/11/23 0109 01/11/23 0418  BP: 123/78 (!) 92/53  Pulse: (!) 59 (!) 52  Resp: 18 18  Temp: 36.6 C 36.7 C  SpO2:      Last Pain:  Vitals:   01/11/23 0418  TempSrc: Oral  PainSc: 0-No pain   Pain Goal:                   Yoltzin Barg

## 2023-01-11 NOTE — Progress Notes (Signed)
Post Partum Day 1 Subjective: no complaints, up ad lib, voiding, and tolerating PO  Objective: Blood pressure (!) 92/53, pulse (!) 52, temperature 98 F (36.7 C), temperature source Oral, resp. rate 18, height 5\' 5"  (1.651 m), weight 90.9 kg, SpO2 100%, unknown if currently breastfeeding.  Physical Exam:  General: alert, cooperative, and appears stated age 32: appropriate Uterine Fundus: firm Incision: healing well, no significant drainage, no dehiscence DVT Evaluation: No evidence of DVT seen on physical exam. Negative Homan's sign. No cords or calf tenderness.  Recent Labs    01/10/23 0017 01/11/23 0414  HGB 12.0 11.9*  HCT 37.1 37.4    Assessment/Plan: Discharge home and Breastfeeding   LOS: 1 day   Mitchel Honour, DO 01/11/2023, 9:17 AM

## 2023-02-06 ENCOUNTER — Telehealth (HOSPITAL_COMMUNITY): Payer: Self-pay | Admitting: *Deleted

## 2023-02-06 NOTE — Telephone Encounter (Signed)
02/06/2023  Name: Jamie Wagner MRN: [id number] DOB: 1991-05-17  Reason for Call:  Transition of Care Hospital Discharge Call  Contact Status: Patient Contact Status: Complete  Language assistant needed: Interpreter Mode: Interpreter Not Needed        Follow-Up Questions: Do You Have Any Concerns About Your Health As You Heal From Delivery?: No Do You Have Any Concerns About Your Infants Health?: No  Edinburgh Postnatal Depression Scale:  In the Past 7 Days: I have been able to laugh and see the funny side of things.: As much as I always could I have looked forward with enjoyment to things.: As much as I ever did I have blamed myself unnecessarily when things went wrong.: Not very often I have been anxious or worried for no good reason.: No, not at all I have felt scared or panicky for no good reason.: No, not at all Things have been getting on top of me.: No, I have been coping as well as ever I have been so unhappy that I have had difficulty sleeping.: Not at all I have felt sad or miserable.: No, not at all I have been so unhappy that I have been crying.: No, never The thought of harming myself has occurred to me.: Never Inocente Salles Postnatal Depression Scale Total: 1  PHQ2-9 Depression Scale:     Discharge Follow-up: Edinburgh score requires follow up?: No Patient was advised of the following resources:: Breastfeeding Support Group, Support Group (declines postpartum group information via email) Did patient express any COVID concerns?: No  Post-discharge interventions: Reviewed Newborn Safe Sleep Practices  Salena Saner, RN 02/06/2023

## 2023-03-11 ENCOUNTER — Encounter: Payer: Self-pay | Admitting: Internal Medicine

## 2023-03-11 ENCOUNTER — Ambulatory Visit: Payer: BC Managed Care – PPO | Admitting: Internal Medicine

## 2023-03-11 VITALS — BP 122/68 | HR 73 | Temp 97.7°F | Resp 16 | Ht 65.0 in | Wt 186.2 lb

## 2023-03-11 DIAGNOSIS — E6609 Other obesity due to excess calories: Secondary | ICD-10-CM | POA: Diagnosis not present

## 2023-03-11 DIAGNOSIS — Z6830 Body mass index (BMI) 30.0-30.9, adult: Secondary | ICD-10-CM | POA: Diagnosis not present

## 2023-03-11 DIAGNOSIS — E66811 Obesity, class 1: Secondary | ICD-10-CM | POA: Insufficient documentation

## 2023-03-11 MED ORDER — PHENTERMINE HCL 37.5 MG PO TABS: 37.5000 mg | ORAL_TABLET | Freq: Every day | ORAL | 0 refills | Status: DC

## 2023-03-11 NOTE — Assessment & Plan Note (Signed)
She obesity without any association.  I want her to watch portion control and to make healthy choices with eating.  I want her to do more cardio at this time.  We will start her on a trial of adipex.  She has not had problems in the past.  Our goal will be to lose 3-4 lbs over the next month.  Her goal is to get to about 135 lbs.

## 2023-03-11 NOTE — Progress Notes (Signed)
Office Visit  Subjective   Patient ID: Jamie Wagner   DOB: August 19, 1990   Age: 32 y.o.   MRN: [id number]   Chief Complaint Chief Complaint  Patient presents with   Follow-up     History of Present Illness The patient is a 32 yo female who comes in today to discuss weight loss measures.  She states she was doing well with her weight but became pregnant this past year where she delivered in 12/2022.  She is currently walking 2 miles per day and she does weights and elliptical at home as well.  She is trying to choose healthy choices.  She states she eats fast food twice per week.  There is no soft drinks or sweet teas.  She does snack at times but usually these are nutritious.  She has been on adipex in the past and wants to be restarted.     Past Medical History Past Medical History:  Diagnosis Date   Gestational diabetes      Allergies Allergies  Allergen Reactions   Omnicef [Cefdinir] Other (See Comments)    GI intolerance   Z-Pak [Azithromycin] Nausea And Vomiting   Iodinated Contrast Media Nausea Only   Sudafed [Pseudoephedrine] Anxiety     Medications  Current Outpatient Medications:    esomeprazole (NEXIUM) 20 MG capsule, Take 20 mg by mouth daily at 12 noon., Disp: , Rfl:    Probiotic Product (PROBIOTIC-10 PO), Take 1 capsule by mouth daily. , Disp: , Rfl:    sertraline (ZOLOFT) 50 MG tablet, Take 1 tablet by mouth daily., Disp: , Rfl:    Review of Systems Review of Systems  Constitutional:  Negative for chills and fever.  Respiratory:  Negative for shortness of breath.   Cardiovascular:  Negative for chest pain and palpitations.  Gastrointestinal:  Negative for abdominal pain, constipation, diarrhea, nausea and vomiting.  Neurological:  Negative for dizziness, weakness and headaches.       Objective:    Vitals BP 122/68   Pulse 73   Temp 97.7 F (36.5 C)   Resp 16   Ht 5\' 5"  (1.651 m)   Wt 186 lb 3.2 oz (84.5 kg)   SpO2 98%   BMI 30.99 kg/m     Physical Examination Physical Exam Constitutional:      Appearance: Normal appearance. She is not ill-appearing.  Cardiovascular:     Rate and Rhythm: Normal rate and regular rhythm.     Pulses: Normal pulses.     Heart sounds: No murmur heard.    No friction rub. No gallop.  Pulmonary:     Effort: Pulmonary effort is normal. No respiratory distress.     Breath sounds: No wheezing, rhonchi or rales.  Abdominal:     General: Bowel sounds are normal. There is no distension.     Palpations: Abdomen is soft.     Tenderness: There is no abdominal tenderness.  Musculoskeletal:     Right lower leg: No edema.     Left lower leg: No edema.  Skin:    General: Skin is warm and dry.     Findings: No rash.  Neurological:     Mental Status: She is alert.        Assessment & Plan:   Class 1 obesity due to excess calories without serious comorbidity with body mass index (BMI) of 30.0 to 30.9 in adult She obesity without any association.  I want her to watch portion control and to make healthy  choices with eating.  I want her to do more cardio at this time.  We will start her on a trial of adipex.  She has not had problems in the past.  Our goal will be to lose 3-4 lbs over the next month.  Her goal is to get to about 135 lbs.    Return in about 4 weeks (around 04/08/2023).   Crist Fat, MD

## 2023-04-08 ENCOUNTER — Ambulatory Visit: Payer: BC Managed Care – PPO | Admitting: Internal Medicine

## 2023-04-08 ENCOUNTER — Encounter: Payer: Self-pay | Admitting: Internal Medicine

## 2023-04-08 VITALS — BP 120/78 | HR 105 | Temp 98.2°F | Resp 17 | Ht 65.0 in | Wt 177.8 lb

## 2023-04-08 DIAGNOSIS — Z6829 Body mass index (BMI) 29.0-29.9, adult: Secondary | ICD-10-CM

## 2023-04-08 DIAGNOSIS — E663 Overweight: Secondary | ICD-10-CM

## 2023-04-08 MED ORDER — PHENTERMINE HCL 37.5 MG PO TABS: 37.5000 mg | ORAL_TABLET | Freq: Every day | ORAL | 1 refills | Status: DC

## 2023-04-08 NOTE — Progress Notes (Signed)
Office Visit  Subjective   Patient ID: Jamie Wagner   DOB: 17-May-1991   Age: 32 y.o.   MRN: [id number]   Chief Complaint Chief Complaint  Patient presents with   Follow-up     History of Present Illness The patient is a 32 yo female who returns today for weight loss management.  I started her on adipex in 02/2023 and over the last month she has lost 9 lbs.  She states she was doing well with her weight but became pregnant this past year where she delivered in 12/2022.  She is currently walking 2 miles per day and she does weights and elliptical at home as well.  She is trying to choose healthy choices with her diet.  She was eating fast food twice per week and is making better choices..  There is no soft drinks or sweet teas.  She does snack at times but usually these are nutritious.  She denies any side effects from the adipex.     Past Medical History Past Medical History:  Diagnosis Date   Gestational diabetes      Allergies Allergies  Allergen Reactions   Omnicef [Cefdinir] Other (See Comments)    GI intolerance   Z-Pak [Azithromycin] Nausea And Vomiting   Iodinated Contrast Media Nausea Only   Sudafed [Pseudoephedrine] Anxiety     Medications  Current Outpatient Medications:    esomeprazole (NEXIUM) 20 MG capsule, Take 20 mg by mouth daily at 12 noon., Disp: , Rfl:    phentermine (ADIPEX-P) 37.5 MG tablet, Take 1 tablet (37.5 mg total) by mouth daily before breakfast., Disp: 30 tablet, Rfl: 1   Probiotic Product (PROBIOTIC-10 PO), Take 1 capsule by mouth daily. , Disp: , Rfl:    sertraline (ZOLOFT) 50 MG tablet, Take 1 tablet by mouth daily., Disp: , Rfl:    Review of Systems Review of Systems  Constitutional:  Negative for chills and fever.  Eyes:  Negative for blurred vision and double vision.  Respiratory:  Negative for shortness of breath.   Cardiovascular:  Negative for chest pain and palpitations.  Gastrointestinal:  Negative for abdominal pain,  constipation, diarrhea, nausea and vomiting.  Neurological:  Negative for headaches.       Objective:    Vitals BP 120/78   Pulse (!) 105   Temp 98.2 F (36.8 C)   Resp 17   Ht 5\' 5"  (1.651 m)   Wt 177 lb 12.8 oz (80.6 kg)   SpO2 99%   BMI 29.59 kg/m    Physical Examination Physical Exam Constitutional:      Appearance: Normal appearance. She is not ill-appearing.  Cardiovascular:     Rate and Rhythm: Normal rate and regular rhythm.     Pulses: Normal pulses.     Heart sounds: No murmur heard.    No friction rub. No gallop.  Pulmonary:     Effort: Pulmonary effort is normal. No respiratory distress.     Breath sounds: No wheezing, rhonchi or rales.  Abdominal:     General: Bowel sounds are normal. There is no distension.     Palpations: Abdomen is soft.     Tenderness: There is no abdominal tenderness.  Musculoskeletal:     Right lower leg: No edema.     Left lower leg: No edema.  Skin:    General: Skin is warm and dry.     Findings: No rash.  Neurological:     Mental Status: She is alert.  Assessment & Plan:   BMI 29.0-29.9,adult She has had great weight loss and moved from obesity to overweight.  I want her to watch portion control and to make healthy choices with eating. I want her to do more cardio at this time. We will continue on adipex for 2 more months.  Our goal will be to lose 3-4 lbs over the next month. Her goal is to get to about 135 lbs.     Return in about 2 months (around 06/08/2023).   Crist Fat, MD

## 2023-04-08 NOTE — Assessment & Plan Note (Signed)
She has had great weight loss and moved from obesity to overweight.  I want her to watch portion control and to make healthy choices with eating. I want her to do more cardio at this time. We will continue on adipex for 2 more months.  Our goal will be to lose 3-4 lbs over the next month. Her goal is to get to about 135 lbs.

## 2023-06-06 ENCOUNTER — Ambulatory Visit: Payer: BC Managed Care – PPO | Admitting: Internal Medicine

## 2023-06-06 ENCOUNTER — Encounter: Payer: Self-pay | Admitting: Internal Medicine

## 2023-06-06 VITALS — BP 102/64 | HR 76 | Temp 98.1°F | Resp 18 | Ht 65.0 in | Wt 173.5 lb

## 2023-06-06 DIAGNOSIS — E663 Overweight: Secondary | ICD-10-CM

## 2023-06-06 DIAGNOSIS — Z6828 Body mass index (BMI) 28.0-28.9, adult: Secondary | ICD-10-CM | POA: Diagnosis not present

## 2023-06-06 MED ORDER — PHENTERMINE HCL 37.5 MG PO TABS: 37.5000 mg | ORAL_TABLET | Freq: Every day | ORAL | 1 refills | Status: DC

## 2023-06-06 NOTE — Progress Notes (Signed)
Office Visit  Subjective   Patient ID: Jamie Wagner   DOB: 1991/04/26   Age: 32 y.o.   MRN: [id number]   Chief Complaint Chief Complaint  Patient presents with   Follow-up    Patient is here for a follow up on weight loss     History of Present Illness The patient is a 32 yo female who returns today for weight loss management.  On her last visit, I asked her to to more cardio but she has been busy over the holidays.  She has lost another 4 lbs over the last 2 months.  Again, I started her on adipex in 02/2023 where she weighed 186 lbs.  She states she was doing well with her weight but became pregnant this past year where she delivered in 12/2022.  She is trying to walk 2 miles per day and she does weights and elliptical at home as well.  She is trying to choose healthy choices with her diet.  She was eating fast food twice per week and is making better choices..  There is no soft drinks or sweet teas.  She does snack at times but usually these are nutritious.  She denies any side effects from the adipex.     Past Medical History Past Medical History:  Diagnosis Date   Gestational diabetes      Allergies Allergies  Allergen Reactions   Omnicef [Cefdinir] Other (See Comments)    GI intolerance   Z-Pak [Azithromycin] Nausea And Vomiting   Iodinated Contrast Media Nausea Only   Sudafed [Pseudoephedrine] Anxiety     Medications  Current Outpatient Medications:    esomeprazole (NEXIUM) 20 MG capsule, Take 20 mg by mouth daily at 12 noon., Disp: , Rfl:    phentermine (ADIPEX-P) 37.5 MG tablet, Take 1 tablet (37.5 mg total) by mouth daily before breakfast., Disp: 30 tablet, Rfl: 1   Probiotic Product (PROBIOTIC-10 PO), Take 1 capsule by mouth daily. , Disp: , Rfl:    sertraline (ZOLOFT) 50 MG tablet, Take 1 tablet by mouth daily., Disp: , Rfl:    Review of Systems Review of Systems  Constitutional:  Negative for chills and fever.  Eyes:  Negative for blurred vision.   Respiratory:  Negative for shortness of breath.   Cardiovascular:  Negative for chest pain and palpitations.  Gastrointestinal:  Negative for abdominal pain, diarrhea, nausea and vomiting.  Neurological:  Negative for dizziness and headaches.       Objective:    Vitals BP 102/64 (BP Location: Left Arm, Patient Position: Sitting)   Pulse 76   Temp 98.1 F (36.7 C) (Temporal)   Resp 18   Ht 5\' 5"  (1.651 m)   Wt 173 lb 8 oz (78.7 kg)   LMP 05/27/2023   SpO2 99%   BMI 28.87 kg/m    Physical Examination Physical Exam Constitutional:      Appearance: Normal appearance. She is not ill-appearing.  Cardiovascular:     Rate and Rhythm: Normal rate and regular rhythm.     Pulses: Normal pulses.     Heart sounds: No murmur heard.    No friction rub. No gallop.  Pulmonary:     Effort: Pulmonary effort is normal. No respiratory distress.     Breath sounds: No wheezing, rhonchi or rales.  Abdominal:     General: Bowel sounds are normal. There is no distension.     Palpations: Abdomen is soft.     Tenderness: There is no abdominal  tenderness.  Musculoskeletal:     Right lower leg: No edema.     Left lower leg: No edema.  Skin:    General: Skin is warm and dry.     Findings: No rash.  Neurological:     Mental Status: She is alert.        Assessment & Plan:   Overweight She is going to try to get back to her regular schedule with exercise and she is going to try to do more cardio.  I am going to refill her adipex for 2 months.  Our goal will be to lose 2-3 lbs per month while on adipex.  BMI 28.0-28.9,adult Plan as above.    Return in about 2 months (around 08/07/2023).   Crist Fat, MD

## 2023-06-06 NOTE — Assessment & Plan Note (Signed)
She is going to try to get back to her regular schedule with exercise and she is going to try to do more cardio.  I am going to refill her adipex for 2 months.  Our goal will be to lose 2-3 lbs per month while on adipex.

## 2023-06-06 NOTE — Assessment & Plan Note (Signed)
Plan as above.  

## 2023-07-23 ENCOUNTER — Encounter: Payer: Self-pay | Admitting: Internal Medicine

## 2023-07-23 ENCOUNTER — Ambulatory Visit: Payer: No Typology Code available for payment source | Admitting: Internal Medicine

## 2023-07-23 VITALS — BP 122/78 | HR 74 | Temp 97.7°F | Resp 18 | Ht 65.0 in | Wt 178.0 lb

## 2023-07-23 DIAGNOSIS — R067 Sneezing: Secondary | ICD-10-CM | POA: Diagnosis not present

## 2023-07-23 DIAGNOSIS — J00 Acute nasopharyngitis [common cold]: Secondary | ICD-10-CM

## 2023-07-23 LAB — POC COVID19 BINAXNOW: SARS Coronavirus 2 Ag: NEGATIVE

## 2023-07-23 MED ORDER — METHYLPREDNISOLONE SODIUM SUCC 40 MG IJ SOLR
80.0000 mg | Freq: Once | INTRAMUSCULAR | Status: AC
Start: 2023-07-23 — End: 2023-07-23
  Administered 2023-07-23: 80 mg via INTRAMUSCULAR

## 2023-07-23 NOTE — Progress Notes (Signed)
   Acute Office Visit  Subjective:     Patient ID: Jamie Wagner, female    DOB: January 30, 1991, 33 y.o.   MRN: [id number]  Chief Complaint  Patient presents with   office visit    Itchy eyes , sneezing  no fever for days     HPI Patient is in today for running nose and watery eyes for 3 days. She is taking zyrtec and Flonase but is not helping. No fever or chills. No body pain and no SOB. She does feel tired. No other complaint.  Review of Systems  Constitutional: Negative.   HENT:  Positive for sinus pain.   Respiratory: Negative.    Cardiovascular: Negative.   Gastrointestinal: Negative.         Objective:    BP 122/78 (BP Location: Left Arm, Patient Position: Sitting, Cuff Size: Normal)   Pulse 74   Temp 97.7 F (36.5 C)   Resp 18   Ht 5\' 5"  (1.651 m)   Wt 178 lb (80.7 kg)   LMP 07/21/2023   SpO2 99%   BMI 29.62 kg/m    Physical Exam Constitutional:      Appearance: Normal appearance.  HENT:     Head: Normocephalic and atraumatic.  Cardiovascular:     Rate and Rhythm: Normal rate and regular rhythm.     Heart sounds: Normal heart sounds.  Pulmonary:     Effort: Pulmonary effort is normal.     Breath sounds: Normal breath sounds.  Neurological:     Mental Status: She is alert.     Results for orders placed or performed in visit on 07/23/23  POC COVID-19 BinaxNow  Result Value Ref Range   SARS Coronavirus 2 Ag Negative Negative        Assessment & Plan:   Problem List Items Addressed This Visit       Respiratory   Acute rhinitis     Her COVID test is negative.  I will give her solumedrol 80 mg intramuscular x1.  She will take Allegra D or Zyrtec D for few days.  She will continue to drink plenty water and if she is not better then she will call.      Relevant Medications   methylPREDNISolone sodium succinate (SOLU-MEDROL) 40 mg/mL injection 80 mg (Start on 07/23/2023  9:45 AM)   Other Visit Diagnoses       Sneezing    -  Primary    Relevant Medications   methylPREDNISolone sodium succinate (SOLU-MEDROL) 40 mg/mL injection 80 mg (Start on 07/23/2023  9:45 AM)   Other Relevant Orders   POC COVID-19 BinaxNow (Completed)       No orders of the defined types were placed in this encounter.   No follow-ups on file.  Eloisa Northern, MD

## 2023-07-23 NOTE — Assessment & Plan Note (Signed)
Her COVID test is negative.  I will give her solumedrol 80 mg intramuscular x1.  She will take Allegra D or Zyrtec D for few days.  She will continue to drink plenty water and if she is not better then she will call.

## 2023-08-11 ENCOUNTER — Encounter: Payer: Self-pay | Admitting: Internal Medicine

## 2023-08-11 ENCOUNTER — Ambulatory Visit: Payer: No Typology Code available for payment source | Admitting: Internal Medicine

## 2023-08-11 VITALS — BP 120/76 | HR 91 | Temp 98.7°F | Resp 18 | Ht 65.0 in | Wt 174.0 lb

## 2023-08-11 DIAGNOSIS — Z6828 Body mass index (BMI) 28.0-28.9, adult: Secondary | ICD-10-CM | POA: Diagnosis not present

## 2023-08-11 MED ORDER — PHENTERMINE HCL 37.5 MG PO TABS: 37.5000 mg | ORAL_TABLET | Freq: Every day | ORAL | 2 refills | Status: DC

## 2023-08-11 NOTE — Assessment & Plan Note (Signed)
I had a discussion regarding her exercise.  She states she is now back in her routine.  I will give her 3 months of adipex.  Our goal will be to lose 2-4 lbs per month while on adipex.

## 2023-08-11 NOTE — Progress Notes (Signed)
Office Visit  Subjective   Patient ID: Jamie Wagner   DOB: 04-Aug-1990   Age: 33 y.o.   MRN: [id number]   Chief Complaint Chief Complaint  Patient presents with   Follow-up     History of Present Illness The patient is a 33 yo female who returns today for weight loss management.  We saw her 2 months ago and she was trying get back to her regular schedule with exercise and doing more cardio.  Over the interim, she has gone back on schedule and she has lost an additional 4 lbs over the last 2 months.  Again, I started her on adipex in 02/2023 where she weighed 186 lbs.  She states she was doing well with her weight but became pregnant this past year where she delivered in 12/2022.  She is trying to walk 2 miles per day and she does weights and elliptical at home as well.  She is trying to choose healthy choices with her diet.  She has stopped eating fast food over the interim.  There is no soft drinks or sweet teas.  She does snack at times but usually these are nutritious.  She denies any side effects from the adipex.     Past Medical History Past Medical History:  Diagnosis Date   Gestational diabetes      Allergies Allergies  Allergen Reactions   Omnicef [Cefdinir] Other (See Comments)    GI intolerance   Z-Pak [Azithromycin] Nausea And Vomiting   Iodinated Contrast Media Nausea Only   Sudafed [Pseudoephedrine] Anxiety     Medications  Current Outpatient Medications:    esomeprazole (NEXIUM) 20 MG capsule, Take 20 mg by mouth daily at 12 noon., Disp: , Rfl:    phentermine (ADIPEX-P) 37.5 MG tablet, Take 1 tablet (37.5 mg total) by mouth daily before breakfast., Disp: 30 tablet, Rfl: 1   Probiotic Product (PROBIOTIC-10 PO), Take 1 capsule by mouth daily. , Disp: , Rfl:    sertraline (ZOLOFT) 50 MG tablet, Take 1 tablet by mouth daily., Disp: , Rfl:    Review of Systems Review of Systems  Constitutional:  Negative for chills and fever.  Eyes:  Negative for blurred  vision.  Respiratory:  Negative for shortness of breath.   Cardiovascular:  Negative for chest pain and palpitations.  Gastrointestinal:  Negative for abdominal pain, constipation, diarrhea, nausea and vomiting.  Neurological:  Negative for dizziness, weakness and headaches.       Objective:    Vitals BP 120/76   Pulse 91   Temp 98.7 F (37.1 C)   Resp 18   Ht 5\' 5"  (1.651 m)   Wt 174 lb (78.9 kg)   LMP 07/21/2023   SpO2 99%   BMI 28.96 kg/m    Physical Examination Physical Exam Constitutional:      Appearance: Normal appearance. She is not ill-appearing.  Cardiovascular:     Rate and Rhythm: Normal rate and regular rhythm.     Pulses: Normal pulses.     Heart sounds: No murmur heard.    No friction rub. No gallop.  Pulmonary:     Effort: Pulmonary effort is normal. No respiratory distress.     Breath sounds: No wheezing, rhonchi or rales.  Abdominal:     General: Bowel sounds are normal. There is no distension.     Palpations: Abdomen is soft.     Tenderness: There is no abdominal tenderness.  Musculoskeletal:     Right lower leg: No  edema.     Left lower leg: No edema.  Skin:    General: Skin is warm and dry.     Findings: No rash.  Neurological:     Mental Status: She is alert.        Assessment & Plan:   BMI 28.0-28.9,adult I had a discussion regarding her exercise.  She states she is now back in her routine.  I will give her 3 months of adipex.  Our goal will be to lose 2-4 lbs per month while on adipex.    Return in about 3 months (around 11/08/2023).   Crist Fat, MD

## 2023-09-15 ENCOUNTER — Ambulatory Visit: Admitting: Student

## 2023-09-15 ENCOUNTER — Encounter: Payer: Self-pay | Admitting: Student

## 2023-09-15 VITALS — BP 148/102 | HR 76 | Temp 98.5°F | Resp 18 | Ht 65.0 in | Wt 179.0 lb

## 2023-09-15 DIAGNOSIS — R059 Cough, unspecified: Secondary | ICD-10-CM | POA: Diagnosis not present

## 2023-09-15 DIAGNOSIS — J320 Chronic maxillary sinusitis: Secondary | ICD-10-CM | POA: Diagnosis not present

## 2023-09-15 LAB — POC COVID19 BINAXNOW: SARS Coronavirus 2 Ag: NEGATIVE

## 2023-09-15 MED ORDER — BENZONATATE 100 MG PO CAPS
100.0000 mg | ORAL_CAPSULE | Freq: Three times a day (TID) | ORAL | 0 refills | Status: AC | PRN
Start: 2023-09-15 — End: 2024-09-14

## 2023-09-15 MED ORDER — METHYLPREDNISOLONE 4 MG PO TBPK
ORAL_TABLET | ORAL | 0 refills | Status: DC
Start: 2023-09-15 — End: 2023-11-07

## 2023-09-15 NOTE — Assessment & Plan Note (Addendum)
 COVID-negative, no signs of respiratory distress, no wheezes, lung sounds in all fields clear.  She will use benzonatate 100 mg capsule for cough as needed. I recommend that she continue Mucinex for cough increase fluid hydration.

## 2023-09-15 NOTE — Progress Notes (Signed)
 Acute Office Visit  Subjective:     Patient ID: Jamie Wagner, female    DOB: [birth year], 33 y.o.   MRN: [id number]  Chief Complaint  Patient presents with   Office Visit    Pt reports on Friday she started having cough and congestion, states today she started having dizziness and headaches.     HPI  Patient is in today for complaints of cough and congestion since 4 days ago. The patient reports that her 33 year old was sick and treated for ear infection and rhinitis. Today she reports dizziness upon standing and mild headache. She has a productive cough with white sputum and clear runny nose. Some wheezing at bedtime sleeping propped up.  Patient has been using mucinex DM and flonase. Denies fever, myalgias, nausea/vomiting or diarrhea, and no post nasal drip.   Review of Systems  Constitutional:  Positive for malaise/fatigue.  HENT:  Positive for congestion and sinus pain. Negative for ear discharge, ear pain and sore throat.   Eyes: Negative.   Respiratory:  Positive for cough and sputum production.   Cardiovascular: Negative.   Gastrointestinal: Negative.   Genitourinary: Negative.   Musculoskeletal: Negative.   Skin: Negative.   Neurological: Negative.   Endo/Heme/Allergies: Negative.   Psychiatric/Behavioral: Negative.          Objective:    BP (!) 148/102   Pulse 76   Temp 98.5 F (36.9 C) (Oral)   Resp 18   Ht 5\' 5"  (1.651 m)   Wt 179 lb (81.2 kg)   SpO2 96%   BMI 29.79 kg/m    Physical Exam Vitals reviewed.  Constitutional:      Appearance: She is ill-appearing.  HENT:     Head: Normocephalic and atraumatic.     Right Ear: Tympanic membrane, ear canal and external ear normal. There is no impacted cerumen.     Left Ear: Tympanic membrane, ear canal and external ear normal. There is no impacted cerumen.     Nose: Congestion present. No rhinorrhea.     Right Turbinates: Not enlarged, swollen or pale.     Left Turbinates: Not enlarged, swollen  or pale.     Right Sinus: Maxillary sinus tenderness present. No frontal sinus tenderness.     Left Sinus: Maxillary sinus tenderness present. No frontal sinus tenderness.     Mouth/Throat:     Mouth: Mucous membranes are moist.     Pharynx: Oropharynx is clear. No oropharyngeal exudate or posterior oropharyngeal erythema.  Eyes:     Conjunctiva/sclera: Conjunctivae normal.     Pupils: Pupils are equal, round, and reactive to light.  Cardiovascular:     Rate and Rhythm: Normal rate and regular rhythm.     Pulses: Normal pulses.     Heart sounds: Normal heart sounds.  Pulmonary:     Effort: Pulmonary effort is normal.     Breath sounds: Normal breath sounds.  Abdominal:     General: Abdomen is flat. Bowel sounds are normal.     Palpations: Abdomen is soft.  Musculoskeletal:        General: Normal range of motion.     Cervical back: Normal range of motion and neck supple. Tenderness present. No rigidity.  Lymphadenopathy:     Cervical: Cervical adenopathy present.  Skin:    General: Skin is warm and dry.     Capillary Refill: Capillary refill takes less than 2 seconds.  Neurological:     General: No focal deficit present.  Mental Status: She is alert and oriented to person, place, and time.     Cranial Nerves: No cranial nerve deficit.     Sensory: No sensory deficit.     Motor: No weakness.     Coordination: Coordination normal.     Gait: Gait normal.  Psychiatric:        Mood and Affect: Mood normal.        Behavior: Behavior normal.     Results for orders placed or performed in visit on 09/15/23  POC COVID-19 BinaxNow  Result Value Ref Range   SARS Coronavirus 2 Ag Negative Negative        Assessment & Plan:   Problem List Items Addressed This Visit     Cough - Primary   COVID-negative, no signs of respiratory distress, no wheezes, lung sounds in all fields clear.  She will use benzonatate 100 mg capsule for cough as needed. I recommend that she continue  Mucinex for cough increase fluid hydration.       Relevant Medications   methylPREDNISolone (MEDROL DOSEPAK) 4 MG TBPK tablet   benzonatate (TESSALON PERLES) 100 MG capsule   Other Relevant Orders   POC COVID-19 BinaxNow (Completed)   Maxillary sinusitis   Maxillary sinuses are tender on palpation, negative for rhinitis, pharyngitis, otitis media.  Augmentin clavulanate twice a day for 5 days. Methylprednisone Dosepak for inflammation and cervical adenopathy.      Relevant Medications   methylPREDNISolone (MEDROL DOSEPAK) 4 MG TBPK tablet   benzonatate (TESSALON PERLES) 100 MG capsule    Meds ordered this encounter  Medications   methylPREDNISolone (MEDROL DOSEPAK) 4 MG TBPK tablet    Sig: Take as directed    Dispense:  21 tablet    Refill:  0   benzonatate (TESSALON PERLES) 100 MG capsule    Sig: Take 1 capsule (100 mg total) by mouth 3 (three) times daily as needed.    Dispense:  30 capsule    Refill:  0    Return if symptoms worsen or fail to improve.  Edwena Blow, NP

## 2023-09-15 NOTE — Patient Instructions (Signed)
 What to use for symptom management: pseudoephedrine (for decongestant) antihistamine (like Zyrtec, Claritin, or Allegra) (to dry up) Guaifenesin (to thin out and break up mucus) Dextromethorphan (if coughing) acetaminophen (for aches, pain, or fever) Coricidin HBP (use for nasal congestion if you have high blood pressure)

## 2023-09-15 NOTE — Assessment & Plan Note (Addendum)
 Maxillary sinuses are tender on palpation, negative for rhinitis, pharyngitis, otitis media.  Augmentin clavulanate twice a day for 5 days. Methylprednisone Dosepak for inflammation and cervical adenopathy.

## 2023-11-07 ENCOUNTER — Ambulatory Visit: Payer: No Typology Code available for payment source | Admitting: Internal Medicine

## 2023-11-07 ENCOUNTER — Encounter: Payer: Self-pay | Admitting: Internal Medicine

## 2023-11-07 VITALS — BP 124/78 | HR 68 | Temp 98.1°F | Resp 16 | Ht 65.0 in | Wt 173.8 lb

## 2023-11-07 DIAGNOSIS — F411 Generalized anxiety disorder: Secondary | ICD-10-CM | POA: Diagnosis not present

## 2023-11-07 DIAGNOSIS — E663 Overweight: Secondary | ICD-10-CM

## 2023-11-07 DIAGNOSIS — Z6828 Body mass index (BMI) 28.0-28.9, adult: Secondary | ICD-10-CM

## 2023-11-07 MED ORDER — ALPRAZOLAM 0.5 MG PO TABS: 0.5000 mg | ORAL_TABLET | Freq: Every day | ORAL | 0 refills | Status: DC | PRN

## 2023-11-07 MED ORDER — PHENTERMINE HCL 37.5 MG PO TABS: 37.5000 mg | ORAL_TABLET | Freq: Every day | ORAL | 1 refills | Status: DC

## 2023-11-07 NOTE — Progress Notes (Addendum)
 Office Visit  Subjective   Patient ID: Jamie Wagner   DOB: 07-27-1990   Age: 33 y.o.   MRN: [id number]   Chief Complaint Chief Complaint  Patient presents with   Follow-up     History of Present Illness The patient is a 33 yo female who returns today for weight loss management.  We saw her 2 months ago and she back to her regular schedule with exercise and doing more cardio.  Over the interim, she has lost 6 lbs over the last 3 months.  She realized that her sertraline was causing her to gain weight so she is currently tapering off that medication.  Again, I started her on adipex in 02/2023 where she weighed 186 lbs.  She states she was doing well with her weight but became pregnant this past year where she delivered in 12/2022.  She is trying to walk 2 miles per day and she does weights and elliptical at home as well.  She is trying to choose healthy choices with her diet.  She has stopped eating fast food she does not drink soft drinks or sweet teas.  She does snack at times but usually these are nutritious.  She denies any side effects from the adipex.  She is on adipex 37.5mg  1/2 tab po daily. She realized that her sertraline was causing her to lose weight.       Past Medical History Past Medical History:  Diagnosis Date   Gestational diabetes      Allergies Allergies  Allergen Reactions   Omnicef [Cefdinir] Other (See Comments)    GI intolerance   Z-Pak [Azithromycin] Nausea And Vomiting   Iodinated Contrast Media Nausea Only   Sudafed [Pseudoephedrine] Anxiety     Medications  Current Outpatient Medications:    ALPRAZolam (XANAX) 0.5 MG tablet, Take 1 tablet (0.5 mg total) by mouth daily as needed for anxiety., Disp: 30 tablet, Rfl: 0   benzonatate  (TESSALON  PERLES) 100 MG capsule, Take 1 capsule (100 mg total) by mouth 3 (three) times daily as needed., Disp: 30 capsule, Rfl: 0   esomeprazole (NEXIUM) 20 MG capsule, Take 20 mg by mouth daily at 12 noon., Disp: ,  Rfl:    phentermine  (ADIPEX-P ) 37.5 MG tablet, Take 1 tablet (37.5 mg total) by mouth daily before breakfast., Disp: 30 tablet, Rfl: 1   Probiotic Product (PROBIOTIC-10 PO), Take 1 capsule by mouth daily. , Disp: , Rfl:    Review of Systems Review of Systems  Constitutional:  Negative for chills and fever.  Eyes:  Negative for blurred vision.  Respiratory:  Negative for shortness of breath.   Cardiovascular:  Negative for chest pain and palpitations.  Gastrointestinal:  Negative for abdominal pain, constipation, diarrhea, nausea and vomiting.  Neurological:  Negative for dizziness, weakness and headaches.       Objective:    Vitals BP 124/78   Pulse 68   Temp 98.1 F (36.7 C)   Resp 16   Ht 5\' 5"  (1.651 m)   Wt 173 lb 12.8 oz (78.8 kg)   SpO2 97%   BMI 28.92 kg/m    Physical Examination Physical Exam Constitutional:      Appearance: Normal appearance. She is not ill-appearing.  Cardiovascular:     Rate and Rhythm: Normal rate and regular rhythm.     Pulses: Normal pulses.     Heart sounds: No murmur heard.    No friction rub. No gallop.  Pulmonary:     Effort:  Pulmonary effort is normal. No respiratory distress.     Breath sounds: No wheezing, rhonchi or rales.  Abdominal:     General: Bowel sounds are normal. There is no distension.     Palpations: Abdomen is soft.     Tenderness: There is no abdominal tenderness.  Musculoskeletal:     Right lower leg: No edema.     Left lower leg: No edema.  Skin:    General: Skin is warm and dry.     Findings: No rash.  Neurological:     Mental Status: She is alert.        Assessment & Plan:   Generalized anxiety disorder She states she had anxiety most of her life.  She is to monitor to see if going off sertraline will make her anxiety worse.  She has been on xanax prn in the past and we will give her a refill today as needed.  BMI 28.0-28.9,adult She is losing weight with her adipex.  I want her to continue taking  adipex and continue with lifestyle modification with diet and regular exercise.  Our goal will be to lose 2-3 lbs per month while on adipex.      Return in about 3 months (around 02/07/2024).   Wayne Haines, MD

## 2023-11-07 NOTE — Assessment & Plan Note (Signed)
 She states she had anxiety most of her life.  She is to monitor to see if going off sertraline will make her anxiety worse.  She has been on xanax prn in the past and we will give her a refill today as needed.

## 2023-11-07 NOTE — Assessment & Plan Note (Signed)
 She is losing weight with her adipex.  I want her to continue taking adipex and continue with lifestyle modification with diet and regular exercise.  Our goal will be to lose 2-3 lbs per month while on adipex.

## 2024-02-09 ENCOUNTER — Ambulatory Visit: Admitting: Internal Medicine

## 2024-02-09 ENCOUNTER — Encounter: Payer: Self-pay | Admitting: Internal Medicine

## 2024-02-09 VITALS — BP 140/60 | HR 82 | Temp 97.8°F | Resp 16 | Ht 65.0 in | Wt 166.4 lb

## 2024-02-09 DIAGNOSIS — F410 Panic disorder [episodic paroxysmal anxiety] without agoraphobia: Secondary | ICD-10-CM | POA: Diagnosis not present

## 2024-02-09 DIAGNOSIS — F411 Generalized anxiety disorder: Secondary | ICD-10-CM

## 2024-02-09 DIAGNOSIS — Z6827 Body mass index (BMI) 27.0-27.9, adult: Secondary | ICD-10-CM | POA: Diagnosis not present

## 2024-02-09 MED ORDER — PHENTERMINE HCL 37.5 MG PO TABS: 37.5000 mg | ORAL_TABLET | Freq: Every day | ORAL | 1 refills | Status: DC

## 2024-02-09 MED ORDER — ALPRAZOLAM 0.5 MG PO TABS: 0.5000 mg | ORAL_TABLET | Freq: Every day | ORAL | 0 refills | Status: AC | PRN

## 2024-02-09 NOTE — Assessment & Plan Note (Signed)
 We will give her another 3 months of adipex.  I want her to lose 2-3 lbs per month while on adipex.  Continue with lifestyle modification with healthy eating and exercise.

## 2024-02-09 NOTE — Assessment & Plan Note (Signed)
 She is now off the sertraline and seems to be doing well.  We will refill her xanax  today to be used prn.

## 2024-02-09 NOTE — Assessment & Plan Note (Signed)
 Plan as below.

## 2024-02-09 NOTE — Progress Notes (Signed)
 Office Visit  Subjective   Patient ID: Jamie Wagner   DOB: 25-Apr-1991   Age: 33 y.o.   MRN: [id number]   Chief Complaint Chief Complaint  Patient presents with   Follow-up    3 month follow up     History of Present Illness The patient is a 33 yo female who returns today for weight loss management.  On her last visit, we had her taper off sertraline as it could make her gain weight.  She has not had any worsening anxiety and if she has problems, she will take xanax .  Over the interim, she has lost another 7 lbs over the last 3 months.  Again, I started her on adipex in 02/2023 where she weighed 186 lbs.  She states she was doing well with her weight but became pregnant this past year where she delivered in 12/2022.  She is trying to walk 2 miles per day and she does weights and elliptical at home as well.  She is trying to choose healthy choices with her diet.  She has stopped eating fast food she does not drink soft drinks or sweet teas.  She does snack at times but usually these are nutritious.  She denies any side effects from the adipex.  She is on adipex 37.5mg  1/2 tab po daily.      Past Medical History Past Medical History:  Diagnosis Date   Gestational diabetes      Allergies Allergies  Allergen Reactions   Omnicef [Cefdinir] Other (See Comments)    GI intolerance   Z-Pak [Azithromycin] Nausea And Vomiting   Iodinated Contrast Media Nausea Only   Sudafed [Pseudoephedrine] Anxiety     Medications  Current Outpatient Medications:    benzonatate  (TESSALON  PERLES) 100 MG capsule, Take 1 capsule (100 mg total) by mouth 3 (three) times daily as needed., Disp: 30 capsule, Rfl: 0   esomeprazole (NEXIUM) 20 MG capsule, Take 20 mg by mouth daily at 12 noon., Disp: , Rfl:    Probiotic Product (PROBIOTIC-10 PO), Take 1 capsule by mouth daily. , Disp: , Rfl:    ALPRAZolam  (XANAX ) 0.5 MG tablet, Take 1 tablet (0.5 mg total) by mouth daily as needed for anxiety., Disp: 30  tablet, Rfl: 0   phentermine  (ADIPEX-P ) 37.5 MG tablet, Take 1 tablet (37.5 mg total) by mouth daily before breakfast., Disp: 30 tablet, Rfl: 1   Review of Systems Review of Systems  Constitutional:  Negative for chills and fever.  Eyes:  Negative for blurred vision.  Respiratory:  Negative for shortness of breath.   Cardiovascular:  Negative for chest pain and palpitations.  Gastrointestinal:  Negative for abdominal pain, constipation, diarrhea, nausea and vomiting.  Neurological:  Negative for dizziness, weakness and headaches.       Objective:    Vitals BP (!) 140/60   Pulse 82   Temp 97.8 F (36.6 C) (Temporal)   Resp 16   Ht 5' 5 (1.651 m)   Wt 166 lb 6.4 oz (75.5 kg)   LMP 02/04/2024   SpO2 99%   BMI 27.69 kg/m    Physical Examination Physical Exam Constitutional:      Appearance: Normal appearance. She is not ill-appearing.  Cardiovascular:     Rate and Rhythm: Normal rate and regular rhythm.     Pulses: Normal pulses.     Heart sounds: No murmur heard.    No friction rub. No gallop.  Pulmonary:     Effort: Pulmonary effort is  normal. No respiratory distress.     Breath sounds: No wheezing, rhonchi or rales.  Abdominal:     General: Bowel sounds are normal. There is no distension.     Palpations: Abdomen is soft.     Tenderness: There is no abdominal tenderness.  Musculoskeletal:     Right lower leg: No edema.     Left lower leg: No edema.  Skin:    General: Skin is warm and dry.     Findings: No rash.  Neurological:     Mental Status: She is alert.        Assessment & Plan:   BMI 27.0-27.9,adult We will give her another 3 months of adipex.  I want her to lose 2-3 lbs per month while on adipex.  Continue with lifestyle modification with healthy eating and exercise.  Panic disorder Plan as below.  Generalized anxiety disorder She is now off the sertraline and seems to be doing well.  We will refill her xanax  today to be used prn.    Return  in about 3 months (around 05/11/2024).   Selinda Fleeta Finger, MD

## 2024-02-13 ENCOUNTER — Ambulatory Visit: Admitting: Internal Medicine

## 2024-02-13 ENCOUNTER — Encounter: Payer: Self-pay | Admitting: Internal Medicine

## 2024-02-13 VITALS — BP 120/90 | HR 62 | Temp 98.1°F | Resp 18 | Ht 65.0 in | Wt 167.2 lb

## 2024-02-13 DIAGNOSIS — R519 Headache, unspecified: Secondary | ICD-10-CM

## 2024-02-13 DIAGNOSIS — H9209 Otalgia, unspecified ear: Secondary | ICD-10-CM

## 2024-02-13 LAB — POC COVID19 BINAXNOW: SARS Coronavirus 2 Ag: NEGATIVE

## 2024-02-13 MED ORDER — AMOXICILLIN-POT CLAVULANATE 875-125 MG PO TABS: 1.0000 | ORAL_TABLET | Freq: Two times a day (BID) | ORAL | 0 refills | Status: DC

## 2024-02-13 MED ORDER — FLUTICASONE PROPIONATE 50 MCG/ACT NA SUSP: 1.0000 | Freq: Two times a day (BID) | NASAL | 0 refills | Status: DC

## 2024-02-13 NOTE — Progress Notes (Signed)
 Office Visit  Subjective   Patient ID: Jamie Wagner   DOB: [birth year]   Age: 33 y.o.   MRN: [id number]   Chief Complaint Chief Complaint  Patient presents with   Otalgia    The pt reports 2 days ago she started having bilateral ear pain, and headaches.      History of Present Illness Jamie Wagner is a 33 yo female who comes in today with acute complaints of ear pain and headaches.  She states that she began having frontal headaches 2 days ago and she developed ear pain in her left ear that changed and went into both ears yesterday.  There is no nasal congestion, post nasal drip, sore throat, fevers, chills, cough, mylagias, chest congestion, SOB, wheezing, nausea, vomiting or diarrhea.  She felt this was problems with her sinuses and started flonase  this morning.  Her headache was bad last night but it improved this morning and it is almost resolved when she comes in today.  She denies any history of migraines/headaches.  There is no tick bites.     Past Medical History Past Medical History:  Diagnosis Date   Gestational diabetes      Allergies Allergies  Allergen Reactions   Omnicef [Cefdinir] Other (See Comments)    GI intolerance   Z-Pak [Azithromycin] Nausea And Vomiting   Iodinated Contrast Media Nausea Only   Sudafed [Pseudoephedrine] Anxiety     Medications  Current Outpatient Medications:    ALPRAZolam  (XANAX ) 0.5 MG tablet, Take 1 tablet (0.5 mg total) by mouth daily as needed for anxiety., Disp: 30 tablet, Rfl: 0   benzonatate  (TESSALON  PERLES) 100 MG capsule, Take 1 capsule (100 mg total) by mouth 3 (three) times daily as needed., Disp: 30 capsule, Rfl: 0   esomeprazole (NEXIUM) 20 MG capsule, Take 20 mg by mouth daily at 12 noon., Disp: , Rfl:    phentermine  (ADIPEX-P ) 37.5 MG tablet, Take 1 tablet (37.5 mg total) by mouth daily before breakfast., Disp: 30 tablet, Rfl: 1   Probiotic Product (PROBIOTIC-10 PO), Take 1 capsule by mouth daily. , Disp: , Rfl:     Review of Systems Review of Systems  Constitutional:  Negative for chills and fever.  HENT:  Positive for ear pain. Negative for congestion, sinus pain and sore throat.   Respiratory:  Negative for cough, sputum production, shortness of breath and wheezing.   Gastrointestinal:  Negative for abdominal pain, constipation, diarrhea, nausea and vomiting.  Musculoskeletal:  Negative for myalgias and neck pain.  Skin:  Negative for rash.  Neurological:  Positive for headaches. Negative for dizziness and weakness.       Objective:    Vitals BP (!) 120/90   Pulse 62   Temp 98.1 F (36.7 C) (Temporal)   Resp 18   Ht 5' 5 (1.651 m)   Wt 167 lb 3.2 oz (75.8 kg)   LMP 02/04/2024   SpO2 99%   BMI 27.82 kg/m    Physical Examination Physical Exam Constitutional:      Appearance: Normal appearance. She is not ill-appearing.  HENT:     Right Ear: Tympanic membrane, ear canal and external ear normal.     Left Ear: Tympanic membrane, ear canal and external ear normal.     Nose: Nose normal. No congestion or rhinorrhea.     Mouth/Throat:     Mouth: Mucous membranes are moist.     Pharynx: Oropharynx is clear. No oropharyngeal exudate or posterior oropharyngeal erythema.  Cardiovascular:  Rate and Rhythm: Normal rate and regular rhythm.     Pulses: Normal pulses.     Heart sounds: No murmur heard.    No friction rub. No gallop.  Pulmonary:     Effort: Pulmonary effort is normal. No respiratory distress.     Breath sounds: No wheezing, rhonchi or rales.  Abdominal:     General: Bowel sounds are normal. There is no distension.     Palpations: Abdomen is soft.     Tenderness: There is no abdominal tenderness.  Musculoskeletal:     Right lower leg: No edema.     Left lower leg: No edema.  Skin:    General: Skin is warm and dry.     Findings: No rash.  Neurological:     Mental Status: She is alert.        Assessment & Plan:   Acute nonintractable headache We did a  COVID-19 test on her and this was negative.  She could have a viral illness but she seems to be doing better.  Since the weekend is coming up, I will give her augmentin  and she is only to use this if she starts having more symptoms or blowing yellow/green mucus.  We will also give her flonase .  She states her headache is almost resolved.    No follow-ups on file.   Selinda Fleeta Finger, MD

## 2024-02-13 NOTE — Assessment & Plan Note (Signed)
 We did a COVID-19 test on her and this was negative.  She could have a viral illness but she seems to be doing better.  Since the weekend is coming up, I will give her augmentin  and she is only to use this if she starts having more symptoms or blowing yellow/green mucus.  We will also give her flonase .  She states her headache is almost resolved.

## 2024-03-06 ENCOUNTER — Other Ambulatory Visit: Payer: Self-pay | Admitting: Internal Medicine

## 2024-05-04 ENCOUNTER — Ambulatory Visit: Admitting: Internal Medicine

## 2024-05-04 VITALS — HR 96 | Temp 98.7°F | Ht 65.0 in | Wt 161.0 lb

## 2024-05-04 DIAGNOSIS — U071 COVID-19: Secondary | ICD-10-CM | POA: Insufficient documentation

## 2024-05-04 DIAGNOSIS — R0981 Nasal congestion: Secondary | ICD-10-CM | POA: Diagnosis not present

## 2024-05-04 LAB — POC COVID19 BINAXNOW: SARS Coronavirus 2 Ag: POSITIVE — AB

## 2024-05-04 LAB — POC INFLUENZA A&B (BINAX/QUICKVUE)
Influenza A, POC: NEGATIVE
Influenza B, POC: NEGATIVE

## 2024-05-04 MED ORDER — FLUTICASONE PROPIONATE 50 MCG/ACT NA SUSP: 1.0000 | Freq: Two times a day (BID) | NASAL | 0 refills | Status: AC

## 2024-05-04 MED ORDER — PHENTERMINE HCL 37.5 MG PO TABS: 37.5000 mg | ORAL_TABLET | Freq: Every day | ORAL | 1 refills | Status: AC

## 2024-05-04 NOTE — Progress Notes (Signed)
 Office Visit  Subjective   Patient ID: Jamie Wagner   DOB: 21-Nov-1990   Age: 33 y.o.   MRN: [id number]   Chief Complaint No chief complaint on file.    History of Present Illness The patient comes in today with complaints of upper respiratory symptoms that started 2 nights ago.  She states she is having sinus congestion with clear nasal discharge with post nasal drip and mild sore throat.  The patient denies any fevers, chills, chest congestion, cough, myalgias, headaches, nausea, vomiting or diarrhea.  She has not tried anything OTC.  Her kids have the same symptoms.     Past Medical History Past Medical History:  Diagnosis Date   Gestational diabetes      Allergies Allergies  Allergen Reactions   Omnicef [Cefdinir] Other (See Comments)    GI intolerance   Z-Pak [Azithromycin] Nausea And Vomiting   Iodinated Contrast Media Nausea Only   Sudafed [Pseudoephedrine] Anxiety     Medications  Current Outpatient Medications:    ALPRAZolam  (XANAX ) 0.5 MG tablet, Take 1 tablet (0.5 mg total) by mouth daily as needed for anxiety., Disp: 30 tablet, Rfl: 0   benzonatate  (TESSALON  PERLES) 100 MG capsule, Take 1 capsule (100 mg total) by mouth 3 (three) times daily as needed., Disp: 30 capsule, Rfl: 0   esomeprazole (NEXIUM) 20 MG capsule, Take 20 mg by mouth daily at 12 noon., Disp: , Rfl:    fluticasone  (FLONASE ) 50 MCG/ACT nasal spray, PLACE 1 SPRAY INTO BOTH NOSTRILS IN THE MORNING AND AT BEDTIME., Disp: 48 mL, Rfl: 1   phentermine  (ADIPEX-P ) 37.5 MG tablet, Take 1 tablet (37.5 mg total) by mouth daily before breakfast., Disp: 30 tablet, Rfl: 1   Probiotic Product (PROBIOTIC-10 PO), Take 1 capsule by mouth daily. , Disp: , Rfl:    Review of Systems Review of Systems  Constitutional:  Negative for chills and fever.  Respiratory:  Negative for cough, shortness of breath and wheezing.   Gastrointestinal:  Negative for abdominal pain, constipation, diarrhea, nausea and  vomiting.  Musculoskeletal:  Negative for myalgias.  Skin:  Negative for rash.  Neurological:  Negative for dizziness, weakness and headaches.       Objective:    Vitals There were no vitals taken for this visit.   Physical Examination Physical Exam Constitutional:      Appearance: Normal appearance. She is not ill-appearing.  Cardiovascular:     Rate and Rhythm: Normal rate and regular rhythm.     Pulses: Normal pulses.     Heart sounds: No murmur heard.    No friction rub. No gallop.  Pulmonary:     Effort: Pulmonary effort is normal. No respiratory distress.     Breath sounds: No wheezing, rhonchi or rales.  Abdominal:     General: Bowel sounds are normal. There is no distension.     Palpations: Abdomen is soft.     Tenderness: There is no abdominal tenderness.  Musculoskeletal:     Right lower leg: No edema.     Left lower leg: No edema.  Skin:    General: Skin is warm and dry.     Findings: No rash.  Neurological:     Mental Status: She is alert.        Assessment & Plan:   COVID-19 I had a discussion about using paxlovid and she just wants to do symptomatic care.  I want her to use tylenol  as needed.  We will give her flonase   for the congestion.  Continue supportive care.    No follow-ups on file.   Selinda Fleeta Finger, MD

## 2024-05-04 NOTE — Assessment & Plan Note (Signed)
 I had a discussion about using paxlovid and she just wants to do symptomatic care.  I want her to use tylenol  as needed.  We will give her flonase  for the congestion.  Continue supportive care.

## 2024-05-04 NOTE — Addendum Note (Signed)
 Addended by: LENETTA LACKS on: 05/04/2024 05:11 PM   Modules accepted: Orders
